# Patient Record
Sex: Female | Born: 1955
Health system: Southern US, Community
[De-identification: ages and names within clinical notes are randomized; demographics above are authoritative.]

## PROBLEM LIST (undated history)

## (undated) DIAGNOSIS — F32A Depression, unspecified: Secondary | ICD-10-CM

## (undated) DIAGNOSIS — C73 Malignant neoplasm of thyroid gland: Secondary | ICD-10-CM

## (undated) DIAGNOSIS — F419 Anxiety disorder, unspecified: Secondary | ICD-10-CM

## (undated) HISTORY — DX: Malignant neoplasm of thyroid gland: C73

---

## 1998-01-19 ENCOUNTER — Other Ambulatory Visit: Admission: RE | Admit: 1998-01-19 | Discharge: 1998-01-19 | Payer: Self-pay | Admitting: Endocrinology

## 1998-07-16 ENCOUNTER — Other Ambulatory Visit: Admission: RE | Admit: 1998-07-16 | Discharge: 1998-07-16 | Payer: Self-pay | Admitting: *Deleted

## 1999-01-10 HISTORY — PX: THYROIDECTOMY, PARTIAL: SHX18

## 2003-06-23 ENCOUNTER — Other Ambulatory Visit: Admission: RE | Admit: 2003-06-23 | Discharge: 2003-06-23 | Payer: Self-pay | Admitting: *Deleted

## 2004-09-01 ENCOUNTER — Other Ambulatory Visit: Admission: RE | Admit: 2004-09-01 | Discharge: 2004-09-01 | Payer: Self-pay | Admitting: *Deleted

## 2004-11-01 ENCOUNTER — Other Ambulatory Visit: Admission: RE | Admit: 2004-11-01 | Discharge: 2004-11-01 | Payer: Self-pay | Admitting: *Deleted

## 2005-05-30 ENCOUNTER — Other Ambulatory Visit: Admission: RE | Admit: 2005-05-30 | Discharge: 2005-05-30 | Payer: Self-pay | Admitting: *Deleted

## 2015-12-28 DIAGNOSIS — Z6820 Body mass index (BMI) 20.0-20.9, adult: Secondary | ICD-10-CM | POA: Diagnosis not present

## 2015-12-28 DIAGNOSIS — C73 Malignant neoplasm of thyroid gland: Secondary | ICD-10-CM | POA: Diagnosis not present

## 2015-12-28 DIAGNOSIS — E038 Other specified hypothyroidism: Secondary | ICD-10-CM | POA: Diagnosis not present

## 2016-05-12 DIAGNOSIS — F411 Generalized anxiety disorder: Secondary | ICD-10-CM | POA: Diagnosis not present

## 2016-05-24 DIAGNOSIS — F411 Generalized anxiety disorder: Secondary | ICD-10-CM | POA: Diagnosis not present

## 2017-04-06 DIAGNOSIS — E038 Other specified hypothyroidism: Secondary | ICD-10-CM | POA: Diagnosis not present

## 2017-04-06 DIAGNOSIS — Z0000 Encounter for general adult medical examination without abnormal findings: Secondary | ICD-10-CM | POA: Diagnosis not present

## 2017-04-11 DIAGNOSIS — F331 Major depressive disorder, recurrent, moderate: Secondary | ICD-10-CM | POA: Diagnosis not present

## 2017-04-11 DIAGNOSIS — E7800 Pure hypercholesterolemia, unspecified: Secondary | ICD-10-CM | POA: Diagnosis not present

## 2017-04-11 DIAGNOSIS — J302 Other seasonal allergic rhinitis: Secondary | ICD-10-CM | POA: Diagnosis not present

## 2017-04-11 DIAGNOSIS — Z0000 Encounter for general adult medical examination without abnormal findings: Secondary | ICD-10-CM | POA: Diagnosis not present

## 2017-04-11 DIAGNOSIS — F430 Acute stress reaction: Secondary | ICD-10-CM | POA: Diagnosis not present

## 2017-04-24 ENCOUNTER — Encounter: Payer: Self-pay | Admitting: Internal Medicine

## 2017-08-22 ENCOUNTER — Other Ambulatory Visit: Payer: Self-pay | Admitting: Internal Medicine

## 2017-08-22 DIAGNOSIS — Z1231 Encounter for screening mammogram for malignant neoplasm of breast: Secondary | ICD-10-CM

## 2017-09-25 DIAGNOSIS — E038 Other specified hypothyroidism: Secondary | ICD-10-CM | POA: Diagnosis not present

## 2017-09-25 DIAGNOSIS — F430 Acute stress reaction: Secondary | ICD-10-CM | POA: Diagnosis not present

## 2017-09-25 DIAGNOSIS — Z6821 Body mass index (BMI) 21.0-21.9, adult: Secondary | ICD-10-CM | POA: Diagnosis not present

## 2017-09-25 DIAGNOSIS — R209 Unspecified disturbances of skin sensation: Secondary | ICD-10-CM | POA: Diagnosis not present

## 2017-10-03 ENCOUNTER — Ambulatory Visit: Payer: BLUE CROSS/BLUE SHIELD | Admitting: Neurology

## 2017-10-03 ENCOUNTER — Encounter: Payer: Self-pay | Admitting: Neurology

## 2017-10-03 VITALS — BP 125/73 | HR 74 | Ht 66.0 in | Wt 132.0 lb

## 2017-10-03 DIAGNOSIS — R339 Retention of urine, unspecified: Secondary | ICD-10-CM

## 2017-10-03 DIAGNOSIS — R202 Paresthesia of skin: Secondary | ICD-10-CM | POA: Diagnosis not present

## 2017-10-03 DIAGNOSIS — R29898 Other symptoms and signs involving the musculoskeletal system: Secondary | ICD-10-CM | POA: Diagnosis not present

## 2017-10-03 DIAGNOSIS — G834 Cauda equina syndrome: Secondary | ICD-10-CM

## 2017-10-03 DIAGNOSIS — R200 Anesthesia of skin: Secondary | ICD-10-CM | POA: Diagnosis not present

## 2017-10-03 DIAGNOSIS — F419 Anxiety disorder, unspecified: Secondary | ICD-10-CM

## 2017-10-03 MED ORDER — PREDNISONE 20 MG PO TABS: 60.0000 mg | ORAL_TABLET | Freq: Every day | ORAL | 0 refills | Status: DC

## 2017-10-03 NOTE — Progress Notes (Addendum)
[facility]    Provider:  Dr Jaynee Eagles Referring Provider: Osborne Casco Fransico Him, MD Primary Care Physician:  Haywood Pao, MD  CC:  paresthesias  HPI:  Christine Collier is a 62 y.o. female here as requested by Dr. Osborne Casco for paresthesias. PMHx . She was at [facility] when she came home and slept on the couch she woke up on Monday, no inciting events, no illnesses, she drank several beers over the weekend but otherwise no changes. She noticed numbness in the toes and feet. She went to work and it felt weird didn;t really bother her but never went away. She tried to hydrate. Numbness has increased, spread up the legs even to the thighs and in the pelvic area. No back pain or radicular symptoms. No falls but she [place] the [place].More in the inner than outer thighs. Her calfs feel numb and her toes are tingly. The left is a little worse definitely above the knee now in the last 4-5 days. No weakness. Started 2 weeks ago. There is a lot of anxiety.   Reviewed notes, labs and imaging from outside physicians, which showed:  Labs 09/26/2017: B12 685, BUN 11, creat 0.7 , ferritin 172, cmp unremarkable, tsh nml, sed 2,   Reviewed Dr. Loren Racer notes. Numbness in the leg started after drinking too much at a beach wedding, drove back the next day felt like her feet were numb, she was worried it was due to the amount of liquor she drank, wearing flip flops had no issues walking  Review of Systems: Patient complains of symptoms per HPI as well as the following symptoms: acute leg numbness. Pertinent negatives and positives per HPI. All others negative.   Social History   Socioeconomic History  . Marital status: Married    Spouse name: Not on file  . Number of children: 2  . Years of education: Not on file  . Highest education level: Associate degree: academic program  Occupational History  . Not on file  Social Needs  . Financial resource strain: Not on file  . Food  insecurity:    Worry: Not on file    Inability: Not on file  . Transportation needs:    Medical: Not on file    Non-medical: Not on file  Tobacco Use  . Smoking status: Never Smoker  . Smokeless tobacco: Never Used  Substance and Sexual Activity  . Alcohol use: Yes    Comment: social  . Drug use: Not Currently    Comment: smoked pot in past but not now  . Sexual activity: Not on file  Lifestyle  . Physical activity:    Days per week: Not on file    Minutes per session: Not on file  . Stress: Not on file  Relationships  . Social connections:    Talks on phone: Not on file    Gets together: Not on file    Attends religious service: Not on file    Active member of club or organization: Not on file    Attends meetings of clubs or organizations: Not on file    Relationship status: Not on file  . Intimate partner violence:    Fear of current or ex partner: Not on file    Emotionally abused: Not on file    Physically abused: Not on file    Forced sexual activity: Not on file  Other Topics Concern  . Not on file  Social History Narrative   Lives at  home with her husband and daughter   Right handed    Caffeine: 2 cups of coffee daily    Family History  Problem Relation Age of Onset  . Colon cancer Mother   . Autoimmune disease Neg Hx     Past Medical History:  Diagnosis Date  . Thyroid cancer [facility])     Past Surgical History:  Procedure Laterality Date  . THYROIDECTOMY, PARTIAL  2001    Current Outpatient Medications  Medication Sig Dispense Refill  . levothyroxine (SYNTHROID, LEVOTHROID) 75 MCG tablet Take 75 mcg by mouth daily before breakfast.    . predniSONE (DELTASONE) 20 MG tablet Take 3 tablets (60 mg total) by mouth daily. 15 tablet 0   No current facility-administered medications for this visit.     Allergies as of 10/03/2017 - Review Complete 10/03/2017  Allergen Reaction Noted  . Polysporin [bacitracin-polymyxin b]  10/03/2017    Vitals: BP  125/73 (BP Location: Right Arm, Patient Position: Sitting)   Pulse 74   Ht 5' 6" (1.676 m)   Wt 132 lb (59.9 kg)   BMI 21.31 kg/m  Last Weight:  Wt Readings from Last 1 Encounters:  10/03/17 132 lb (59.9 kg)   Last Height:   Ht Readings from Last 1 Encounters:  10/03/17 5' 6" (1.676 m)    Physical exam: Exam: Gen: NAD, conversant, well nourised, obese, well groomed                     CV: RRR, no MRG. No Carotid Bruits. No peripheral edema, warm, nontender Eyes: Conjunctivae clear without exudates or hemorrhage  Neuro: Detailed Neurologic Exam  Speech:    Speech is normal; fluent and spontaneous with normal comprehension.  Cognition:    The patient is oriented to person, place, and time;     recent and remote memory intact;     language fluent;     normal attention, concentration,     fund of knowledge Cranial Nerves:    The pupils are equal, round, and reactive to light. The fundi are normal and spontaneous venous pulsations are present. Visual fields are full to finger confrontation. Extraocular movements are intact. Trigeminal sensation is intact and the muscles of mastication are normal. The face is symmetric. The palate elevates in the midline. Hearing intact. Voice is normal. Shoulder shrug is normal. The tongue has normal motion without fasciculations.   Coordination:    Normal finger to nose and heel to shin. Normal rapid alternating movements.   Gait:    Heel-toe and tandem gait are normal.   Motor Observation:    No asymmetry, no atrophy, and no involuntary movements noted. Tone:    Normal muscle tone.    Posture:    Posture is normal. normal erect    Strength: 4/5 prox hip flexion weakness.     Strength is V/V in the upper and lower limbs.      Sensation: intact to LT, pin prick, intact vibration distally on left but absent on the right great toe,  decreased to temp in feet bilat     Reflex Exam:  DTR's:    Deep tendon reflexes in the upper and  lower extremities are normal bilaterally.   Toes:    The toes are downgoing bilaterally.   Clonus:    Clonus is absent.      Assessment/Plan:  62 year old female with 2 weeks of acute onset, ascending numbness in the legs and now possibly in the  fingers. She denies any previous illnesses or medication changes but was drinking alcohol and staying up late the prior weekend at the beach. Denied drug use. Ate shell fish but would be unlikely for neurotoxin but can't be sure. She is extremely anxious which I suspect is the cause however may less likely be a mild form of sensory GBS. Exam was mostly unremarkable. Cannot rule out lumbar spine etiology, if no improvement or worsening need MRi lumbar spine.  - Addendum 11/05/2017: History: This is an extremely anxious 62 year old patient who was seen for acute onset ascending paresthesias in the lower extremities.  She is here with her husband today.  Extensive work-up reviewed today with both patient and husband.  This includes extensive lab testing including Lyme, hemoglobin A1c ANA with comprehensive rheumatologic panel, Sjogren's, pan ANCA, RPR, hep C, rheumatoid factor, heavy metals, multiple myeloma, vitamin B1, CBC, CMP, HIV, sed rate.  Reviewed MRI of the lumbar spine as well which showed multilevel degenerative changes with possible L3 nerve root compression, scoliosis however nothing to explain her presenting symptoms.  Husband discussed the patient has been extremely anxious and in the setting of stopping some medications.  I do think that this may be anxiety and we will watch it clinically.   - Discussed possibly getting an LP and an emg/ncs. Unfortunately it takes several weeks to get these completed outpatient. She declines an LP and we will try to move up her emg/ncs  - will follow patient carefully. Discussed risks of steroids stop for anything concerning. Tried a short course of steroids that did not help  Orders Placed This Encounter    Procedures  . MR LUMBAR SPINE WO CONTRAST  . NCV with EMG(electromyography)   Meds ordered this encounter  Medications  . predniSONE (DELTASONE) 20 MG tablet    Sig: Take 3 tablets (60 mg total) by mouth daily.    Dispense:  15 tablet    Refill:  0       [name], MD  [facility] [address] [place], [place] [address]  Phone [phone] Fax [phone]

## 2017-10-03 NOTE — Patient Instructions (Addendum)
Guillain-Barre Syndrome Guillain-Barre syndrome (GBS) is a rare disorder in which your body's defense (immune) system attacks your nervous system. GBS is a kind of autoimmune disorder. GBS is not contagious and most people with GBS recover within a few months. However, some people may still have some weakness after a few years. What are the causes? The exact cause of GBS syndrome is not known. The disorder usually develops a few days or weeks after a viral infection, such as a stomach (gastrointestinal) or breathing (respiratory) infection. Sometimes surgery or vaccinations will trigger the syndrome. What are the signs or symptoms? The most common signs and symptoms of GBS are tingling, numbness, and weakness in your lower legs. You may have more symptoms in other areas of your body after a few weeks. Signs and symptoms may eventually include:  Muscle weakness that spreads from your legs to your arms and trunk.  Difficulty breathing.  Total loss of muscle use.  Facial weakness.  Double vision.  Trouble swallowing.  Slurred speech.  Aching or burning pain.  Rapid breathing and heart rate.  Flushing or cold and clammy skin.  Dizziness when standing.  Trouble passing urine or having bowel movements.  How is this diagnosed? Your health care provider will do a physical exam to diagnose GBS. You may also have tests to confirm GBS. These may include:  A nerve conduction velocity (NCV) test to check for slowing of nerve signals.  A spinal tap to check for protein in the fluid around the spinal cord.  How is this treated? Treatment focuses on relieving symptoms and speeding up recovery. The most important part of treatment is to keep your body functioning while your nervous system recovers. Severe cases of GBS may require hospitalization. Possible treatments include:  Plasmapheresis.This is a type of blood transfusion. It removes immune system cells that are attacking your nervous  system.  Intravenous injections of special proteins (immunoglobulins or antibodies). These proteins may slow down the immune system's attack on peripheral nerves.  Medicines to control the symptoms of GBS.  Follow these instructions at home:  Physical therapy may be recommended by your health care provider. Follow your exercises as directed by your physical therapist.  Make sure you have the support you need.  Keep all follow-up visits as directed by your health care provider. This is important.  Take medicines only as directed by your health care provider. Contact a health care provider if:  You have new symptoms or your symptoms get worse.  You do not feel safe or supported at home. Get help right away if:  You have trouble breathing.  You have trouble swallowing.  You choke after eating or drinking.  You cannot move.  You pass out. This information is not intended to replace advice given to you by your health care provider. Make sure you discuss any questions you have with your health care provider. Document Released: 12/16/2001 Document Revised: 06/03/2015 Document Reviewed: 02/26/2013 Elsevier Interactive Patient Education  2018 [employer].  Prednisone tablets What is this medicine? PREDNISONE (PRED ni sone) is a corticosteroid. It is commonly used to treat inflammation of the skin, joints, lungs, and other organs. Common conditions treated include asthma, allergies, and arthritis. It is also used for other conditions, such as blood disorders and diseases of the adrenal glands. This medicine may be used for other purposes; ask your health care provider or pharmacist if you have questions. COMMON BRAND NAME(S): Deltasone, Predone, Sterapred, Sterapred DS What should I tell my  health care provider before I take this medicine? They need to know if you have any of these conditions: -Cushing's syndrome -diabetes -glaucoma -heart disease -high blood  pressure -infection (especially a virus infection such as chickenpox, cold sores, or herpes) -kidney disease -liver disease -mental illness -myasthenia gravis -osteoporosis -seizures -stomach or intestine problems -thyroid disease -an unusual or allergic reaction to lactose, prednisone, other medicines, foods, dyes, or preservatives -pregnant or trying to get pregnant -breast-feeding How should I use this medicine? Take this medicine by mouth with a glass of water. Follow the directions on the prescription label. Take this medicine with food. If you are taking this medicine once a day, take it in the morning. Do not take more medicine than you are told to take. Do not suddenly stop taking your medicine because you may develop a severe reaction. Your doctor will tell you how much medicine to take. If your doctor wants you to stop the medicine, the dose may be slowly lowered over time to avoid any side effects. Talk to your pediatrician regarding the use of this medicine in children. Special care may be needed. Overdosage: If you think you have taken too much of this medicine contact a poison control center or emergency room at once. NOTE: This medicine is only for you. Do not share this medicine with others. What if I miss a dose? If you miss a dose, take it as soon as you can. If it is almost time for your next dose, talk to your doctor or health care professional. You may need to miss a dose or take an extra dose. Do not take double or extra doses without advice. What may interact with this medicine? Do not take this medicine with any of the following medications: -metyrapone -mifepristone This medicine may also interact with the following medications: -aminoglutethimide -amphotericin B -aspirin and aspirin-like medicines -barbiturates -certain medicines for diabetes, like glipizide or glyburide -cholestyramine -cholinesterase  inhibitors -cyclosporine -digoxin -diuretics -ephedrine -female hormones, like estrogens and birth control pills -isoniazid -ketoconazole -NSAIDS, medicines for pain and inflammation, like ibuprofen or naproxen -phenytoin -rifampin -toxoids -vaccines -warfarin This list may not describe all possible interactions. Give your health care provider a list of all the medicines, herbs, non-prescription drugs, or dietary supplements you use. Also tell them if you smoke, drink alcohol, or use illegal drugs. Some items may interact with your medicine. What should I watch for while using this medicine? Visit your doctor or health care professional for regular checks on your progress. If you are taking this medicine over a prolonged period, carry an identification card with your name and address, the type and dose of your medicine, and your doctor's name and address. This medicine may increase your risk of getting an infection. Tell your doctor or health care professional if you are around anyone with measles or chickenpox, or if you develop sores or blisters that do not heal properly. If you are going to have surgery, tell your doctor or health care professional that you have taken this medicine within the last twelve months. Ask your doctor or health care professional about your diet. You may need to lower the amount of salt you eat. This medicine may affect blood sugar levels. If you have diabetes, check with your doctor or health care professional before you change your diet or the dose of your diabetic medicine. What side effects may I notice from receiving this medicine? Side effects that you should report to your doctor or health care  professional as soon as possible: -allergic reactions like skin rash, itching or hives, swelling of the face, lips, or tongue -changes in emotions or moods -changes in vision -depressed mood -eye pain -fever or chills, cough, sore throat, pain or difficulty  passing urine -increased thirst -swelling of ankles, feet Side effects that usually do not require medical attention (report to your doctor or health care professional if they continue or are bothersome): -confusion, excitement, restlessness -headache -nausea, vomiting -skin problems, acne, thin and shiny skin -trouble sleeping -weight gain This list may not describe all possible side effects. Call your doctor for medical advice about side effects. You may report side effects to FDA at 1-800-FDA-1088. Where should I keep my medicine? Keep out of the reach of children. Store at room temperature between 15 and 30 degrees C (59 and 86 degrees F). Protect from light. Keep container tightly closed. Throw away any unused medicine after the expiration date. NOTE: This sheet is a summary. It may not cover all possible information. If you have questions about this medicine, talk to your doctor, pharmacist, or health care provider.  2018 Elsevier/Gold Standard (2010-08-11 10:57:14)

## 2017-10-08 ENCOUNTER — Telehealth: Payer: Self-pay | Admitting: Neurology

## 2017-10-08 NOTE — Telephone Encounter (Signed)
Pt has been taking prednisone for 5 days. She is wanting to discuss how she is doing. Please call to advise

## 2017-10-08 NOTE — Addendum Note (Signed)
Addended by: [name] B on: 10/08/2017 06:04 PM   Modules accepted: Orders

## 2017-10-08 NOTE — Telephone Encounter (Signed)
She isn't any better. I ordered an MRI lumbar spine. Can we try to find a spot this week for an emg/ncs? Lets discuss with Beau thanks

## 2017-10-08 NOTE — Telephone Encounter (Signed)
Called pt back and her vm answered immediately however it was full. Will attempt again later.

## 2017-10-09 ENCOUNTER — Telehealth: Payer: Self-pay | Admitting: Neurology

## 2017-10-09 NOTE — Telephone Encounter (Signed)
I sent patient an email about this, no need to call thanks

## 2017-10-09 NOTE — Telephone Encounter (Signed)
Spoke with patient. Before patient schedules EMG and MRI pt wanted to know if Dr. Jaynee Eagles thinks it's a possibility that she was bitten by a tick. She wants to find out first before she schedules the EMG/NCS and MRI. She will keep her phone nearby for the call back.

## 2017-10-09 NOTE — Telephone Encounter (Signed)
Called patient to schedule sooner NCV / EMG study and VM answered immediately.

## 2017-10-09 NOTE — Telephone Encounter (Signed)
tried to leave a vmail but the vmail box was not set up & i tried calling her husband # & that number was not in serves  [place]: [phone] (exp. 10/09/17 to 11/07/17)

## 2017-10-09 NOTE — Telephone Encounter (Signed)
Spoke with Dr. Jaynee Eagles. She can check labs including Lyme before we schedule the EMG/NCS. She still advises the MRI of the lumbar spine which needs to be scheduled with Raquel Sarna.

## 2017-10-09 NOTE — Telephone Encounter (Signed)
Pt. Has nvm to leave message to schedule sooner appt.

## 2017-10-09 NOTE — Telephone Encounter (Signed)
Attempted to call patient. Her vm box answered immediately but has not been setup yet. Will try patient again later. I have an EMG/NCS to offer at 3:30 Thursday, arrival 3:00 pm.

## 2017-10-09 NOTE — Telephone Encounter (Signed)
Attempted to call pt again. Her vm box has not been setup. Phone only rang once.

## 2017-10-10 ENCOUNTER — Other Ambulatory Visit: Payer: Self-pay | Admitting: Neurology

## 2017-10-10 ENCOUNTER — Other Ambulatory Visit (INDEPENDENT_AMBULATORY_CARE_PROVIDER_SITE_OTHER): Payer: Self-pay

## 2017-10-10 DIAGNOSIS — G610 Guillain-Barre syndrome: Secondary | ICD-10-CM

## 2017-10-10 DIAGNOSIS — G608 Other hereditary and idiopathic neuropathies: Secondary | ICD-10-CM | POA: Diagnosis not present

## 2017-10-10 DIAGNOSIS — Z0289 Encounter for other administrative examinations: Secondary | ICD-10-CM

## 2017-10-10 NOTE — Telephone Encounter (Signed)
Patient came by and schedule her MRI for 10/16/17 at [facility].

## 2017-10-11 DIAGNOSIS — F430 Acute stress reaction: Secondary | ICD-10-CM | POA: Diagnosis not present

## 2017-10-11 DIAGNOSIS — F331 Major depressive disorder, recurrent, moderate: Secondary | ICD-10-CM | POA: Diagnosis not present

## 2017-10-13 LAB — PAN-ANCA
ANCA Proteinase 3: <3.5 U/mL (ref 0.0–3.5)
C-ANCA: <1:20 {titer}

## 2017-10-13 LAB — HEAVY METALS, BLOOD
ARSENIC: 6 ug/L (ref 2–23)
LEAD, BLOOD: 2 ug/dL (ref 0–4)
MERCURY: 1.7 ug/L (ref 0.0–14.9)

## 2017-10-13 LAB — MULTIPLE MYELOMA PANEL, SERUM
ALBUMIN/GLOB SERPL: 1.7 (ref 0.7–1.7)
Albumin SerPl Elph-Mcnc: 3.7 g/dL (ref 2.9–4.4)
Alpha 1: 0.2 g/dL (ref 0.0–0.4)
Alpha2 Glob SerPl Elph-Mcnc: 0.7 g/dL (ref 0.4–1.0)
B-Globulin SerPl Elph-Mcnc: 0.9 g/dL (ref 0.7–1.3)
GAMMA GLOB SERPL ELPH-MCNC: 0.6 g/dL (ref 0.4–1.8)
GLOBULIN, TOTAL: 2.3 g/dL (ref 2.2–3.9)
IGA/IMMUNOGLOBULIN A, SERUM: 106 mg/dL (ref 87–352)
IgG (Immunoglobin G), Serum: 657 mg/dL — ABNORMAL LOW (ref 700–1600)
IgM (Immunoglobulin M), Srm: 46 mg/dL (ref 26–217)

## 2017-10-13 LAB — RHEUMATOID FACTOR: RHEUMATOID FACTOR: 11.9 [IU]/mL (ref 0.0–13.9)

## 2017-10-13 LAB — COMPREHENSIVE METABOLIC PANEL
ALBUMIN: 4 g/dL (ref 3.6–4.8)
ALT: 14 IU/L (ref 0–32)
AST: 12 IU/L (ref 0–40)
Albumin/Globulin Ratio: 2 (ref 1.2–2.2)
Alkaline Phosphatase: 58 IU/L (ref 39–117)
BUN/Creatinine Ratio: 19 (ref 12–28)
BUN: 15 mg/dL (ref 8–27)
Bilirubin Total: 0.5 mg/dL (ref 0.0–1.2)
CALCIUM: 9.4 mg/dL (ref 8.7–10.3)
CO2: 25 mmol/L (ref 20–29)
CREATININE: 0.77 mg/dL (ref 0.57–1.00)
Chloride: 103 mmol/L (ref 96–106)
GFR calc Af Amer: 96 mL/min/{1.73_m2} (ref >59)
GFR calc non Af Amer: 84 mL/min/{1.73_m2} (ref >59)
GLOBULIN, TOTAL: 2 g/dL (ref 1.5–4.5)
Glucose: 89 mg/dL (ref 65–99)
Potassium: 4.4 mmol/L (ref 3.5–5.2)
SODIUM: 143 mmol/L (ref 134–144)
Total Protein: 6 g/dL (ref 6.0–8.5)

## 2017-10-13 LAB — ANA W/REFLEX: ANA: POSITIVE — AB

## 2017-10-13 LAB — CBC
HEMATOCRIT: 43.7 % (ref 34.0–46.6)
Hemoglobin: 15.1 g/dL (ref 11.1–15.9)
MCH: 30.9 pg (ref 26.6–33.0)
MCHC: 34.6 g/dL (ref 31.5–35.7)
MCV: 89 fL (ref 79–97)
PLATELETS: 360 10*3/uL (ref 150–450)
RBC: 4.89 x10E6/uL (ref 3.77–5.28)
RDW: 12.4 % (ref 12.3–15.4)
WBC: 7.8 10*3/uL (ref 3.4–10.8)

## 2017-10-13 LAB — HIV ANTIBODY (ROUTINE TESTING W REFLEX): HIV Screen 4th Generation wRfx: NONREACTIVE

## 2017-10-13 LAB — HEMOGLOBIN A1C
Est. average glucose Bld gHb Est-mCnc: 105 mg/dL
HEMOGLOBIN A1C: 5.3 % (ref 4.8–5.6)

## 2017-10-13 LAB — SJOGREN'S SYNDROME ANTIBODS(SSA + SSB): ENA SSB (LA) Ab: 2.7 AI — ABNORMAL HIGH (ref 0.0–0.9)

## 2017-10-13 LAB — ENA+DNA/DS+SJORGEN'S
DSDNA AB: 1 [IU]/mL (ref 0–9)
ENA RNP Ab: <0.2 AI (ref 0.0–0.9)
ENA SM Ab Ser-aCnc: <0.2 AI (ref 0.0–0.9)

## 2017-10-13 LAB — SEDIMENTATION RATE: SED RATE: 2 mm/h (ref 0–40)

## 2017-10-13 LAB — VITAMIN B1: Thiamine: 112.5 nmol/L (ref 66.5–200.0)

## 2017-10-13 LAB — HEPATITIS C ANTIBODY: Hep C Virus Ab: <0.1 s/co ratio (ref 0.0–0.9)

## 2017-10-13 LAB — RPR: RPR Ser Ql: NONREACTIVE

## 2017-10-13 LAB — B. BURGDORFI ANTIBODIES: Lyme IgG/IgM Ab: <0.91 {ISR} (ref 0.00–0.90)

## 2017-10-16 ENCOUNTER — Encounter: Payer: Self-pay | Admitting: *Deleted

## 2017-10-16 ENCOUNTER — Ambulatory Visit (INDEPENDENT_AMBULATORY_CARE_PROVIDER_SITE_OTHER): Payer: BLUE CROSS/BLUE SHIELD

## 2017-10-16 DIAGNOSIS — R202 Paresthesia of skin: Secondary | ICD-10-CM

## 2017-10-16 DIAGNOSIS — G834 Cauda equina syndrome: Secondary | ICD-10-CM

## 2017-10-16 DIAGNOSIS — R29898 Other symptoms and signs involving the musculoskeletal system: Secondary | ICD-10-CM

## 2017-10-16 DIAGNOSIS — R200 Anesthesia of skin: Secondary | ICD-10-CM

## 2017-10-16 DIAGNOSIS — R339 Retention of urine, unspecified: Secondary | ICD-10-CM | POA: Diagnosis not present

## 2017-11-01 ENCOUNTER — Ambulatory Visit: Payer: BLUE CROSS/BLUE SHIELD | Admitting: Neurology

## 2017-11-01 ENCOUNTER — Ambulatory Visit (INDEPENDENT_AMBULATORY_CARE_PROVIDER_SITE_OTHER): Payer: BLUE CROSS/BLUE SHIELD | Admitting: Neurology

## 2017-11-01 DIAGNOSIS — R200 Anesthesia of skin: Secondary | ICD-10-CM | POA: Diagnosis not present

## 2017-11-01 DIAGNOSIS — R202 Paresthesia of skin: Secondary | ICD-10-CM | POA: Diagnosis not present

## 2017-11-01 DIAGNOSIS — Z0289 Encounter for other administrative examinations: Secondary | ICD-10-CM

## 2017-11-01 NOTE — Patient Instructions (Addendum)
[facility]   Living With Anxiety After being diagnosed with an anxiety disorder, you may be relieved to know why you have felt or behaved a certain way. It is natural to also feel overwhelmed about the treatment ahead and what it will mean for your life. With care and support, you can manage this condition and recover from it. How to cope with anxiety Dealing with stress Stress is your body's reaction to life changes and events, both good and bad. Stress can last just a few hours or it can be ongoing. Stress can play a major role in anxiety, so it is important to learn both how to cope with stress and how to think about it differently. Talk with your health care provider or a counselor to learn more about stress reduction. He or she may suggest some stress reduction techniques, such as:  Music therapy. This can include creating or listening to music that you enjoy and that inspires you.  Mindfulness-based meditation. This involves being aware of your normal breaths, rather than trying to control your breathing. It can be done while sitting or walking.  Centering prayer. This is a kind of meditation that involves focusing on a word, phrase, or sacred image that is meaningful to you and that brings you peace.  Deep breathing. To do this, expand your stomach and inhale slowly through your nose. Hold your breath for 3-5 seconds. Then exhale slowly, allowing your stomach muscles to relax.  Self-talk. This is a skill where you identify thought patterns that lead to anxiety reactions and correct those thoughts.  Muscle relaxation. This involves tensing muscles then relaxing them.  Choose a stress reduction technique that fits your lifestyle and personality. Stress reduction techniques take time and practice. Set aside 5-15 minutes a day to do them. Therapists can offer training in these techniques. The training may be covered by some insurance plans. Other things you  can do to manage stress include:  Keeping a stress diary. This can help you learn what triggers your stress and ways to control your response.  Thinking about how you respond to certain situations. You may not be able to control everything, but you can control your reaction.  Making time for activities that help you relax, and not feeling guilty about spending your time in this way.  Therapy combined with coping and stress-reduction skills provides the best chance for successful treatment. Medicines Medicines can help ease symptoms. Medicines for anxiety include:  Anti-anxiety drugs.  Antidepressants.  Beta-blockers.  Medicines may be used as the main treatment for anxiety disorder, along with therapy, or if other treatments are not working. Medicines should be prescribed by a health care provider. Relationships Relationships can play a big part in helping you recover. Try to spend more time connecting with trusted friends and family members. Consider going to couples counseling, taking family education classes, or going to family therapy. Therapy can help you and others better understand the condition. How to recognize changes in your condition Everyone has a different response to treatment for anxiety. Recovery from anxiety happens when symptoms decrease and stop interfering with your daily activities at home or work. This may mean that you will start to:  Have better concentration and focus.  Sleep better.  Be less irritable.  Have more energy.  Have improved memory.  It is important to recognize when your condition is getting worse. Contact your health care provider if your symptoms interfere with home or work  and you do not feel like your condition is improving. Where to find help and support: You can get help and support from these sources:  Self-help groups.  Online and [employer].  A trusted spiritual leader.  Couples counseling.  Family education  classes.  Family therapy.  Follow these instructions at home:  Eat a healthy diet that includes plenty of vegetables, fruits, whole grains, low-fat dairy products, and lean protein. Do not eat a lot of foods that are high in solid fats, added sugars, or salt.  Exercise. Most adults should do the following: ? Exercise for at least 150 minutes each week. The exercise should increase your heart rate and make you sweat (moderate-intensity exercise). ? Strengthening exercises at least twice a week.  Cut down on caffeine, tobacco, alcohol, and other potentially harmful substances.  Get the right amount and quality of sleep. Most adults need 7-9 hours of sleep each night.  Make choices that simplify your life.  Take over-the-counter and prescription medicines only as told by your health care provider.  Avoid caffeine, alcohol, and certain over-the-counter cold medicines. These may make you feel worse. Ask your pharmacist which medicines to avoid.  Keep all follow-up visits as told by your health care provider. This is important. Questions to ask your health care provider  Would I benefit from therapy?  How often should I follow up with a health care provider?  How long do I need to take medicine?  Are there any long-term side effects of my medicine?  Are there any alternatives to taking medicine? Contact a health care provider if:  You have a hard time staying focused or finishing daily tasks.  You spend many hours a day feeling worried about everyday life.  You become exhausted by worry.  You start to have headaches, feel tense, or have nausea.  You urinate more than normal.  You have diarrhea. Get help right away if:  You have a racing heart and shortness of breath.  You have thoughts of hurting yourself or others. If you ever feel like you may hurt yourself or others, or have thoughts about taking your own life, get help right away. You can go to your nearest emergency  department or call:  Your local emergency services (911 in the [place]  A suicide crisis helpline, such as the [facility] at 585-077-3173. This is open 24-hours a day.  Summary  Taking steps to deal with stress can help calm you.  Medicines cannot cure anxiety disorders, but they can help ease symptoms.  Family, friends, and partners can play a big part in helping you recover from an anxiety disorder. This information is not intended to replace advice given to you by your health care provider. Make sure you discuss any questions you have with your health care provider. Document Released: 12/21/2015 Document Revised: 12/21/2015 Document Reviewed: 12/21/2015 Elsevier Interactive Patient Education  [employer].

## 2017-11-01 NOTE — Progress Notes (Signed)
See procedure note.

## 2017-11-05 NOTE — Progress Notes (Signed)
Full Name: Christine Collier Gender: Female MRN #: [id number] Date of Birth: Aug 24, 1955    Visit Date: 11/01/2017 09:06 Age: 62 Years 1 Months Old Examining Physician: Domenick Gong, MD  Referring Physician: Jaynee Eagles, MD  History: Patient with acute onset paresthesias in the limbs in the setting of anxiety  Summary: EMG/NCS performed on the right arm and right leg    Conclusion: This is a normal study.  [name] M.D.  [facility] [place], [place] [address] Tel: [phone] Fax: [phone]        [facility]    Nerve / Sites Muscle Latency Ref. Amplitude Ref. Rel Amp Segments Distance Velocity Ref. Area    ms ms mV mV %  cm m/s m/s mVms  R Median - APB     Wrist APB 2.6 ?4.4 6.8 ?4.0 100 Wrist - APB 7   27.1     Upper arm APB 6.3  6.6  97.9 Upper arm - Wrist 20 55 ?49 25.4  R Ulnar - ADM     Wrist ADM 2.3 ?3.3 6.9 ?6.0 100 Wrist - ADM 7   21.2     B.Elbow ADM 5.4  6.8  98.9 B.Elbow - Wrist 19 62 ?49 20.9     A.Elbow ADM 7.3  6.8  99.6 A.Elbow - B.Elbow 12 61 ?49 21.5         A.Elbow - Wrist      R Peroneal - EDB     Ankle EDB 5.3 ?6.5 5.0 ?2.0 100 Ankle - EDB 9   21.6     Fib head EDB 10.8  4.7  94.2 Fib head - Ankle 29 53 ?44 21.2     Pop fossa EDB 12.8  4.9  105 Pop fossa - Fib head 10 49 ?44 22.1         Pop fossa - Ankle      R Tibial - AH     Ankle AH 4.0 ?5.8 20.7 ?4.0 100 Ankle - AH 9   51.1     Pop fossa AH 12.4  17.0  81.9 Pop fossa - Ankle 38 45 ?41 44.0             SNC    Nerve / Sites Rec. Site Peak Lat Ref.  Amp Ref. Segments Distance Peak Diff Ref.    ms ms V V  cm ms ms  R Sural - Ankle (Calf)     Calf Ankle 3.9 ?4.4 25 ?6 Calf - Ankle 14    R Superficial peroneal - Ankle     Lat leg Ankle 4.0 ?4.4 8 ?6 Lat leg - Ankle 14    R Median, Ulnar - Transcarpal comparison     Median Palm Wrist 2.0 ?2.2 72 ?35 Median Palm - Wrist 8       Ulnar Palm Wrist 2.1 ?2.2 32 ?12 Ulnar Palm - Wrist 8          Median Palm -  Ulnar Palm  -0.1 ?0.4  R Median - Orthodromic (Dig II, Mid palm)     Dig II Wrist 3.1 ?3.4 32 ?10 Dig II - Wrist 13    R Ulnar - Orthodromic, (Dig V, Mid palm)     Dig V Wrist 2.6 ?3.1 16 ?5 Dig V - Wrist 75                 F  Wave    Nerve F Lat Ref.  ms ms  R Tibial - AH 49.4 ?56.0  R Ulnar - ADM 26.3 ?32.0         EMG full       EMG Summary Table    Spontaneous MUAP Recruitment  Muscle IA Fib PSW Fasc Other Amp Dur. Poly Pattern  R. Vastus medialis Normal None None None _______ Normal Normal Normal Normal  R. Tibialis anterior Normal None None None _______ Normal Normal Normal Normal  R. Gastrocnemius (Medial head) Normal None None None _______ Normal Normal Normal Normal  R. Extensor hallucis longus Normal None None None _______ Normal Normal Normal Normal  R. Abductor hallucis Normal None None None _______ Normal Normal Normal Normal

## 2017-11-05 NOTE — Progress Notes (Signed)
History: This is an extremely anxious 62 year old patient who was seen for acute onset ascending paresthesias in the lower extremities.  She is here with her husband today.  Extensive work-up reviewed today with both patient and husband.  This includes extensive lab testing including Lyme, hemoglobin A1c ANA with comprehensive rheumatologic panel, Sjogren's, pan ANCA, RPR, hep C, rheumatoid factor, heavy metals, multiple myeloma, vitamin B1, CBC, CMP, HIV, sed rate.  Reviewed MRI of the lumbar spine as well which showed multilevel degenerative changes with possible L3 nerve root compression, scoliosis however nothing to explain her presenting symptoms.  Husband discussed the patient has been extremely anxious and in the setting of stopping some medications.  I do think that this may be anxiety and we will watch it clinically.   A total of 15 minutes was spent face-to-face with this patient. Over half this time was spent on counseling patient on the  1. Numbness and tingling of both legs     diagnosis and different diagnostic and therapeutic options, counseling and coordination of care, risks ans benefits of management, compliance, or risk factor reduction and education.  This does not include time spent on emg/ncs

## 2017-11-05 NOTE — Procedures (Signed)
Full Name: Christine Collier Gender: Female MRN #: [id number] Date of Birth: 02-08-1955    Visit Date: 11/01/2017 09:06 Age: 62 Years 1 Months Old Examining Physician: Domenick Gong, MD  Referring Physician: Jaynee Eagles, MD  History: Patient with acute onset paresthesias in the limbs in the setting of anxiety  Summary: EMG/NCS performed on the right arm and right leg    Conclusion: This is a normal study.  [name] M.D.  [facility] [place], [place] [address] Tel: [phone] Fax: [phone]        [facility]    Nerve / Sites Muscle Latency Ref. Amplitude Ref. Rel Amp Segments Distance Velocity Ref. Area    ms ms mV mV %  cm m/s m/s mVms  R Median - APB     Wrist APB 2.6 ?4.4 6.8 ?4.0 100 Wrist - APB 7   27.1     Upper arm APB 6.3  6.6  97.9 Upper arm - Wrist 20 55 ?49 25.4  R Ulnar - ADM     Wrist ADM 2.3 ?3.3 6.9 ?6.0 100 Wrist - ADM 7   21.2     B.Elbow ADM 5.4  6.8  98.9 B.Elbow - Wrist 19 62 ?49 20.9     A.Elbow ADM 7.3  6.8  99.6 A.Elbow - B.Elbow 12 61 ?49 21.5         A.Elbow - Wrist      R Peroneal - EDB     Ankle EDB 5.3 ?6.5 5.0 ?2.0 100 Ankle - EDB 9   21.6     Fib head EDB 10.8  4.7  94.2 Fib head - Ankle 29 53 ?44 21.2     Pop fossa EDB 12.8  4.9  105 Pop fossa - Fib head 10 49 ?44 22.1         Pop fossa - Ankle      R Tibial - AH     Ankle AH 4.0 ?5.8 20.7 ?4.0 100 Ankle - AH 9   51.1     Pop fossa AH 12.4  17.0  81.9 Pop fossa - Ankle 38 45 ?41 44.0             SNC    Nerve / Sites Rec. Site Peak Lat Ref.  Amp Ref. Segments Distance Peak Diff Ref.    ms ms V V  cm ms ms  R Sural - Ankle (Calf)     Calf Ankle 3.9 ?4.4 25 ?6 Calf - Ankle 14    R Superficial peroneal - Ankle     Lat leg Ankle 4.0 ?4.4 8 ?6 Lat leg - Ankle 14    R Median, Ulnar - Transcarpal comparison     Median Palm Wrist 2.0 ?2.2 72 ?35 Median Palm - Wrist 8       Ulnar Palm Wrist 2.1 ?2.2 32 ?12 Ulnar Palm - Wrist 8          Median Palm - Ulnar  Palm  -0.1 ?0.4  R Median - Orthodromic (Dig II, Mid palm)     Dig II Wrist 3.1 ?3.4 32 ?10 Dig II - Wrist 13    R Ulnar - Orthodromic, (Dig V, Mid palm)     Dig V Wrist 2.6 ?3.1 16 ?5 Dig V - Wrist 39                 F  Wave    Nerve F Lat Ref.  ms ms  R Tibial - AH 49.4 ?56.0  R Ulnar - ADM 26.3 ?32.0         EMG full       EMG Summary Table    Spontaneous MUAP Recruitment  Muscle IA Fib PSW Fasc Other Amp Dur. Poly Pattern  R. Vastus medialis Normal None None None _______ Normal Normal Normal Normal  R. Tibialis anterior Normal None None None _______ Normal Normal Normal Normal  R. Gastrocnemius (Medial head) Normal None None None _______ Normal Normal Normal Normal  R. Extensor hallucis longus Normal None None None _______ Normal Normal Normal Normal  R. Abductor hallucis Normal None None None _______ Normal Normal Normal Normal

## 2018-02-14 DIAGNOSIS — R209 Unspecified disturbances of skin sensation: Secondary | ICD-10-CM | POA: Diagnosis not present

## 2018-02-14 DIAGNOSIS — E079 Disorder of thyroid, unspecified: Secondary | ICD-10-CM | POA: Diagnosis not present

## 2018-02-14 DIAGNOSIS — Z85850 Personal history of malignant neoplasm of thyroid: Secondary | ICD-10-CM | POA: Diagnosis not present

## 2018-02-14 DIAGNOSIS — E039 Hypothyroidism, unspecified: Secondary | ICD-10-CM | POA: Diagnosis not present

## 2018-02-14 DIAGNOSIS — E042 Nontoxic multinodular goiter: Secondary | ICD-10-CM | POA: Diagnosis not present

## 2018-02-18 ENCOUNTER — Other Ambulatory Visit: Payer: Self-pay | Admitting: Internal Medicine

## 2018-02-18 DIAGNOSIS — E042 Nontoxic multinodular goiter: Secondary | ICD-10-CM

## 2018-02-18 DIAGNOSIS — E079 Disorder of thyroid, unspecified: Secondary | ICD-10-CM

## 2018-02-20 ENCOUNTER — Ambulatory Visit
Admission: RE | Admit: 2018-02-20 | Discharge: 2018-02-20 | Disposition: A | Discharge status: Home / Self Care | Payer: Self-pay | Source: Ambulatory Visit | Attending: Internal Medicine | Admitting: Internal Medicine

## 2018-02-20 ENCOUNTER — Other Ambulatory Visit: Payer: Self-pay | Admitting: Internal Medicine

## 2018-02-20 DIAGNOSIS — E079 Disorder of thyroid, unspecified: Secondary | ICD-10-CM

## 2018-02-20 DIAGNOSIS — E042 Nontoxic multinodular goiter: Secondary | ICD-10-CM

## 2018-03-07 ENCOUNTER — Ambulatory Visit
Admission: RE | Admit: 2018-03-07 | Discharge: 2018-03-07 | Disposition: A | Discharge status: Home / Self Care | Payer: BLUE CROSS/BLUE SHIELD | Source: Ambulatory Visit | Attending: Internal Medicine | Admitting: Internal Medicine

## 2018-03-07 ENCOUNTER — Other Ambulatory Visit (HOSPITAL_COMMUNITY)
Admission: RE | Admit: 2018-03-07 | Discharge: 2018-03-07 | Disposition: A | Discharge status: Home / Self Care | Payer: BLUE CROSS/BLUE SHIELD | Source: Ambulatory Visit | Attending: Radiology | Admitting: Radiology

## 2018-03-07 DIAGNOSIS — E042 Nontoxic multinodular goiter: Secondary | ICD-10-CM | POA: Diagnosis not present

## 2018-03-07 DIAGNOSIS — E079 Disorder of thyroid, unspecified: Secondary | ICD-10-CM | POA: Insufficient documentation

## 2018-03-07 DIAGNOSIS — E041 Nontoxic single thyroid nodule: Secondary | ICD-10-CM | POA: Diagnosis not present

## 2018-03-07 DIAGNOSIS — K130 Diseases of lips: Secondary | ICD-10-CM | POA: Diagnosis not present

## 2018-03-13 DIAGNOSIS — E042 Nontoxic multinodular goiter: Secondary | ICD-10-CM | POA: Diagnosis not present

## 2018-03-20 DIAGNOSIS — F411 Generalized anxiety disorder: Secondary | ICD-10-CM | POA: Diagnosis not present

## 2018-03-27 DIAGNOSIS — F411 Generalized anxiety disorder: Secondary | ICD-10-CM | POA: Diagnosis not present

## 2018-03-28 ENCOUNTER — Encounter (HOSPITAL_COMMUNITY): Payer: Self-pay | Admitting: Internal Medicine

## 2018-04-01 DIAGNOSIS — F411 Generalized anxiety disorder: Secondary | ICD-10-CM | POA: Diagnosis not present

## 2018-04-08 DIAGNOSIS — F411 Generalized anxiety disorder: Secondary | ICD-10-CM | POA: Diagnosis not present

## 2018-04-09 DIAGNOSIS — E038 Other specified hypothyroidism: Secondary | ICD-10-CM | POA: Diagnosis not present

## 2018-04-09 DIAGNOSIS — E7800 Pure hypercholesterolemia, unspecified: Secondary | ICD-10-CM | POA: Diagnosis not present

## 2018-04-12 DIAGNOSIS — F331 Major depressive disorder, recurrent, moderate: Secondary | ICD-10-CM | POA: Diagnosis not present

## 2018-04-12 DIAGNOSIS — F411 Generalized anxiety disorder: Secondary | ICD-10-CM | POA: Diagnosis not present

## 2018-04-16 DIAGNOSIS — F331 Major depressive disorder, recurrent, moderate: Secondary | ICD-10-CM | POA: Diagnosis not present

## 2018-04-16 DIAGNOSIS — Z85850 Personal history of malignant neoplasm of thyroid: Secondary | ICD-10-CM | POA: Diagnosis not present

## 2018-04-16 DIAGNOSIS — F411 Generalized anxiety disorder: Secondary | ICD-10-CM | POA: Diagnosis not present

## 2018-04-16 DIAGNOSIS — Z1331 Encounter for screening for depression: Secondary | ICD-10-CM | POA: Diagnosis not present

## 2018-04-16 DIAGNOSIS — E042 Nontoxic multinodular goiter: Secondary | ICD-10-CM | POA: Diagnosis not present

## 2018-04-16 DIAGNOSIS — E079 Disorder of thyroid, unspecified: Secondary | ICD-10-CM | POA: Diagnosis not present

## 2018-04-16 DIAGNOSIS — Z0000 Encounter for general adult medical examination without abnormal findings: Secondary | ICD-10-CM | POA: Diagnosis not present

## 2018-04-30 DIAGNOSIS — F411 Generalized anxiety disorder: Secondary | ICD-10-CM | POA: Diagnosis not present

## 2018-05-01 DIAGNOSIS — D440 Neoplasm of uncertain behavior of thyroid gland: Secondary | ICD-10-CM | POA: Diagnosis not present

## 2018-05-01 DIAGNOSIS — E042 Nontoxic multinodular goiter: Secondary | ICD-10-CM | POA: Diagnosis not present

## 2018-05-09 DIAGNOSIS — F411 Generalized anxiety disorder: Secondary | ICD-10-CM | POA: Diagnosis not present

## 2018-05-30 DIAGNOSIS — F411 Generalized anxiety disorder: Secondary | ICD-10-CM | POA: Diagnosis not present

## 2018-06-17 DIAGNOSIS — F411 Generalized anxiety disorder: Secondary | ICD-10-CM | POA: Diagnosis not present

## 2018-06-26 DIAGNOSIS — F411 Generalized anxiety disorder: Secondary | ICD-10-CM | POA: Diagnosis not present

## 2018-07-03 DIAGNOSIS — F411 Generalized anxiety disorder: Secondary | ICD-10-CM | POA: Diagnosis not present

## 2018-07-15 DIAGNOSIS — F411 Generalized anxiety disorder: Secondary | ICD-10-CM | POA: Diagnosis not present

## 2018-07-29 DIAGNOSIS — F411 Generalized anxiety disorder: Secondary | ICD-10-CM | POA: Diagnosis not present

## 2018-08-05 DIAGNOSIS — F411 Generalized anxiety disorder: Secondary | ICD-10-CM | POA: Diagnosis not present

## 2018-08-26 DIAGNOSIS — F411 Generalized anxiety disorder: Secondary | ICD-10-CM | POA: Diagnosis not present

## 2018-08-27 ENCOUNTER — Encounter: Payer: Self-pay | Admitting: Adult Health

## 2018-08-27 ENCOUNTER — Ambulatory Visit (INDEPENDENT_AMBULATORY_CARE_PROVIDER_SITE_OTHER): Payer: BC Managed Care – PPO | Admitting: Adult Health

## 2018-08-27 ENCOUNTER — Other Ambulatory Visit: Payer: Self-pay

## 2018-08-27 DIAGNOSIS — F411 Generalized anxiety disorder: Secondary | ICD-10-CM | POA: Diagnosis not present

## 2018-08-27 DIAGNOSIS — F331 Major depressive disorder, recurrent, moderate: Secondary | ICD-10-CM | POA: Diagnosis not present

## 2018-08-27 MED ORDER — ALPRAZOLAM 0.25 MG PO TABS: 0.2500 mg | ORAL_TABLET | Freq: Three times a day (TID) | ORAL | 2 refills | Status: DC | PRN

## 2018-08-27 NOTE — Progress Notes (Signed)
Christine Collier [id number] 11/02/1955 63 y.o.  Subjective:   Patient ID:  Christine Collier is a 63 y.o. (DOB 25-Feb-1955) female.  Chief Complaint:  Chief Complaint  Patient presents with  . Anxiety  . Depression  . Insomnia    HPI  Christine Collier presents to the office today for follow-up of anxiety, depression, and insomnia.  Describes mood today as "ok". Pleasant. Mood symptoms - reports some depression, anxiety, and irritability at times. Stating "I'm doing pretty good now". Feels like Cymbalta and Xanax are working well for her. Currently staying at [facility] with sister. Stable interest and motivation Taking medications as prescribed.  Energy levels stable. Active, does not have a regular exercise routine. Is out of work currently but plans to return upcoming. Enjoys some usual interests and activities. Spending time with family. Appetite adequate. Weight stable. Sleeps well most nights. Averages 6 to 8 hours. Focus and concentration stable. Completing tasks. Managing aspects of household.  Denies SI or HI. Denies AH or VH.   Review of Systems:  Review of Systems  Musculoskeletal: Negative for gait problem.  Neurological: Negative for tremors.  Psychiatric/Behavioral:       Please refer to HPI    Medications: I have reviewed the patient's current medications.  Current Outpatient Medications  Medication Sig Dispense Refill  . ALPRAZolam (XANAX) 0.25 MG tablet Take 1 tablet (0.25 mg total) by mouth 3 (three) times daily as needed. for anxiety 90 tablet 2  . DULoxetine (CYMBALTA) 60 MG capsule Take 60 mg by mouth daily.    Marland Kitchen levothyroxine (SYNTHROID, LEVOTHROID) 75 MCG tablet Take 75 mcg by mouth daily before breakfast.    . predniSONE (DELTASONE) 20 MG tablet Take 3 tablets (60 mg total) by mouth daily. 15 tablet 0   No current facility-administered medications for this visit.     Medication Side Effects: None  Allergies:  Allergies  Allergen  Reactions  . Polysporin [Bacitracin-Polymyxin B]     Past Medical History:  Diagnosis Date  . Thyroid cancer ([place])     Family History  Problem Relation Age of Onset  . Colon cancer Mother   . Autoimmune disease Neg Hx     Social History   Socioeconomic History  . Marital status: Married    Spouse name: Not on file  . Number of children: 2  . Years of education: Not on file  . Highest education level: Associate degree: academic program  Occupational History  . Not on file  Social Needs  . Financial resource strain: Not on file  . Food insecurity    Worry: Not on file    Inability: Not on file  . Transportation needs    Medical: Not on file    Non-medical: Not on file  Tobacco Use  . Smoking status: Never Smoker  . Smokeless tobacco: Never Used  Substance and Sexual Activity  . Alcohol use: Yes    Comment: social  . Drug use: Not Currently    Comment: smoked pot in past but not now  . Sexual activity: Not on file  Lifestyle  . Physical activity    Days per week: Not on file    Minutes per session: Not on file  . Stress: Not on file  Relationships  . Social Herbalist on phone: Not on file    Gets together: Not on file    Attends religious service: Not on file    Active member of club or  organization: Not on file    Attends meetings of clubs or organizations: Not on file    Relationship status: Not on file  . Intimate partner violence    Fear of current or ex partner: Not on file    Emotionally abused: Not on file    Physically abused: Not on file    Forced sexual activity: Not on file  Other Topics Concern  . Not on file  Social History Narrative   Lives at home with her husband and daughter   Right handed    Caffeine: 2 cups of coffee daily    Past Medical History, Surgical history, Social history, and Family history were reviewed and updated as appropriate.   Please see review of systems for further details on the patient's review from  today.   Objective:   Physical Exam:  There were no vitals taken for this visit.  Physical Exam Constitutional:      General: She is not in acute distress.    Appearance: She is well-developed.  Musculoskeletal:        General: No deformity.  Neurological:     Mental Status: She is alert and oriented to person, place, and time.     Coordination: Coordination normal.  Psychiatric:        Attention and Perception: Attention and perception normal. She does not perceive auditory or visual hallucinations.        Mood and Affect: Mood normal. Mood is not anxious or depressed. Affect is not labile, blunt, angry or inappropriate.        Speech: Speech normal.        Behavior: Behavior normal.        Thought Content: Thought content normal. Thought content is not paranoid or delusional. Thought content does not include homicidal or suicidal ideation. Thought content does not include homicidal or suicidal plan.        Cognition and Memory: Cognition and memory normal.        Judgment: Judgment normal.     Comments: Insight intact     Lab Review:     Component Value Date/Time   NA 143 10/10/2017 1414   K 4.4 10/10/2017 1414   CL 103 10/10/2017 1414   CO2 25 10/10/2017 1414   GLUCOSE 89 10/10/2017 1414   BUN 15 10/10/2017 1414   CREATININE 0.77 10/10/2017 1414   CALCIUM 9.4 10/10/2017 1414   PROT 6.0 10/10/2017 1414   ALBUMIN 4.0 10/10/2017 1414   AST 12 10/10/2017 1414   ALT 14 10/10/2017 1414   ALKPHOS 58 10/10/2017 1414   BILITOT 0.5 10/10/2017 1414   GFRNONAA 84 10/10/2017 1414   GFRAA 96 10/10/2017 1414       Component Value Date/Time   WBC 7.8 10/10/2017 1414   RBC 4.89 10/10/2017 1414   HGB 15.1 10/10/2017 1414   HCT 43.7 10/10/2017 1414   PLT 360 10/10/2017 1414   MCV 89 10/10/2017 1414   MCH 30.9 10/10/2017 1414   MCHC 34.6 10/10/2017 1414   RDW 12.4 10/10/2017 1414    No results found for: POCLITH, LITHIUM   No results found for: PHENYTOIN, PHENOBARB,  VALPROATE, CBMZ   .res Assessment: Plan:    Plan:  1. Xanax 0.25mg  TID prn anxiety  RTC 4 weeks  Discussed potential benefits, risk, and side effects of benzodiazepines to include potential risk of tolerance and dependence, as well as possible drowsiness.  Advised patient not to drive if experiencing drowsiness and to take lowest  possible effective dose to minimize risk of dependence and tolerance.  Christine Collier was seen today for anxiety, depression and insomnia.  Diagnoses and all orders for this visit:  Generalized anxiety disorder -     ALPRAZolam (XANAX) 0.25 MG tablet; Take 1 tablet (0.25 mg total) by mouth 3 (three) times daily as needed. for anxiety  Major depressive disorder, recurrent episode, moderate ([place])     Please see After Visit Summary for patient specific instructions.  No future appointments.  No orders of the defined types were placed in this encounter.   -------------------------------

## 2018-09-09 DIAGNOSIS — F411 Generalized anxiety disorder: Secondary | ICD-10-CM | POA: Diagnosis not present

## 2018-09-30 DIAGNOSIS — F411 Generalized anxiety disorder: Secondary | ICD-10-CM | POA: Diagnosis not present

## 2018-10-03 ENCOUNTER — Other Ambulatory Visit: Payer: Self-pay

## 2018-10-03 ENCOUNTER — Ambulatory Visit (INDEPENDENT_AMBULATORY_CARE_PROVIDER_SITE_OTHER): Payer: BC Managed Care – PPO | Admitting: Adult Health

## 2018-10-03 ENCOUNTER — Encounter: Payer: Self-pay | Admitting: Adult Health

## 2018-10-03 DIAGNOSIS — F411 Generalized anxiety disorder: Secondary | ICD-10-CM | POA: Diagnosis not present

## 2018-10-03 DIAGNOSIS — F410 Panic disorder [episodic paroxysmal anxiety] without agoraphobia: Secondary | ICD-10-CM | POA: Diagnosis not present

## 2018-10-03 DIAGNOSIS — G4700 Insomnia, unspecified: Secondary | ICD-10-CM

## 2018-10-03 DIAGNOSIS — F331 Major depressive disorder, recurrent, moderate: Secondary | ICD-10-CM

## 2018-10-03 MED ORDER — DULOXETINE HCL 30 MG PO CPEP: ORAL_CAPSULE | ORAL | 0 refills | Status: DC

## 2018-10-03 MED ORDER — SERTRALINE HCL 50 MG PO TABS: 50.0000 mg | ORAL_TABLET | Freq: Every day | ORAL | 2 refills | Status: DC

## 2018-10-03 NOTE — Progress Notes (Signed)
Christine Collier [id number] 06/29/1955 63 y.o.  Subjective:   Patient ID:  Christine Collier is a 63 y.o. (DOB May 31, 1955) female.  Chief Complaint:  No chief complaint on file.   HPI  [name] presents to the office today for follow-up of anxiety, depression, and insomnia.  Describes mood today as "not the best". Pleasant. Mood symptoms - reports depression, anxiety, and irritability. Stating "I don't feel like the Cymbalta is helping me like it should". Asking to restart the Zoloft. Staying at home and going to sister's at the beach. Not wanting to gettout much. Has days where she "feels good, and then has days with no interest and motivation". Had been on Zoloft for years and it worked well. Was doing well and decided to stop it. Decreased interest and motivation Taking medications as prescribed.  Energy levels stable. Active, does not have a regular exercise routine. Still "furlowed" from eye clinic. Enjoys some usual interests and activities. Spending time with family - husband - daughter. Son and wife building a house in [place]. Parents in [place]. Sister at [facility]. Appetite adequate. Weight stable. Sleeps well most nights. Averages 6 to 8 hours. Focus and concentration stable. Completing tasks. Managing aspects of household.  Denies SI or HI. Denies AH or VH.  Review of Systems:  Review of Systems  Musculoskeletal: Negative for gait problem.  Neurological: Negative for tremors.  Psychiatric/Behavioral: The patient is nervous/anxious.        Please refer to HPI    Medications: I have reviewed the patient's current medications.  Current Outpatient Medications  Medication Sig Dispense Refill  . ALPRAZolam (XANAX) 0.25 MG tablet Take 1 tablet (0.25 mg total) by mouth 3 (three) times daily as needed. for anxiety 90 tablet 2  . DULoxetine (CYMBALTA) 30 MG capsule Take one capsule daily for 7 days, then discontinue. 7 capsule 0  . levothyroxine  (SYNTHROID, LEVOTHROID) 75 MCG tablet Take 75 mcg by mouth daily before breakfast.    . predniSONE (DELTASONE) 20 MG tablet Take 3 tablets (60 mg total) by mouth daily. 15 tablet 0  . sertraline (ZOLOFT) 50 MG tablet Take 1 tablet (50 mg total) by mouth daily. 30 tablet 2   No current facility-administered medications for this visit.     Medication Side Effects: None  Allergies:  Allergies  Allergen Reactions  . Polysporin [Bacitracin-Polymyxin B]     Past Medical History:  Diagnosis Date  . Thyroid cancer ([place])     Family History  Problem Relation Age of Onset  . Colon cancer Mother   . Autoimmune disease Neg Hx     Social History   Socioeconomic History  . Marital status: Married    Spouse name: Not on file  . Number of children: 2  . Years of education: Not on file  . Highest education level: Associate degree: academic program  Occupational History  . Not on file  Social Needs  . Financial resource strain: Not on file  . Food insecurity    Worry: Not on file    Inability: Not on file  . Transportation needs    Medical: Not on file    Non-medical: Not on file  Tobacco Use  . Smoking status: Never Smoker  . Smokeless tobacco: Never Used  Substance and Sexual Activity  . Alcohol use: Yes    Comment: social  . Drug use: Not Currently    Comment: smoked pot in past but not now  . Sexual activity: Not  on file  Lifestyle  . Physical activity    Days per week: Not on file    Minutes per session: Not on file  . Stress: Not on file  Relationships  . Social Herbalist on phone: Not on file    Gets together: Not on file    Attends religious service: Not on file    Active member of club or organization: Not on file    Attends meetings of clubs or organizations: Not on file    Relationship status: Not on file  . Intimate partner violence    Fear of current or ex partner: Not on file    Emotionally abused: Not on file    Physically abused: Not on  file    Forced sexual activity: Not on file  Other Topics Concern  . Not on file  Social History Narrative   Lives at home with her husband and daughter   Right handed    Caffeine: 2 cups of coffee daily    Past Medical History, Surgical history, Social history, and Family history were reviewed and updated as appropriate.   Please see review of systems for further details on the patient's review from today.   Objective:   Physical Exam:  There were no vitals taken for this visit.  Physical Exam Constitutional:      General: She is not in acute distress.    Appearance: She is well-developed.  Musculoskeletal:        General: No deformity.  Neurological:     Mental Status: She is alert and oriented to person, place, and time.     Coordination: Coordination normal.  Psychiatric:        Attention and Perception: Attention and perception normal. She does not perceive auditory or visual hallucinations.        Mood and Affect: Mood is anxious and depressed. Affect is not labile, blunt, angry or inappropriate.        Speech: Speech normal.        Behavior: Behavior normal.        Thought Content: Thought content normal. Thought content is not paranoid or delusional. Thought content does not include homicidal or suicidal ideation. Thought content does not include homicidal or suicidal plan.        Cognition and Memory: Cognition and memory normal.        Judgment: Judgment normal.     Comments: Insight intact     Lab Review:     Component Value Date/Time   NA 143 10/10/2017 1414   K 4.4 10/10/2017 1414   CL 103 10/10/2017 1414   CO2 25 10/10/2017 1414   GLUCOSE 89 10/10/2017 1414   BUN 15 10/10/2017 1414   CREATININE 0.77 10/10/2017 1414   CALCIUM 9.4 10/10/2017 1414   PROT 6.0 10/10/2017 1414   ALBUMIN 4.0 10/10/2017 1414   AST 12 10/10/2017 1414   ALT 14 10/10/2017 1414   ALKPHOS 58 10/10/2017 1414   BILITOT 0.5 10/10/2017 1414   GFRNONAA 84 10/10/2017 1414   GFRAA  96 10/10/2017 1414       Component Value Date/Time   WBC 7.8 10/10/2017 1414   RBC 4.89 10/10/2017 1414   HGB 15.1 10/10/2017 1414   HCT 43.7 10/10/2017 1414   PLT 360 10/10/2017 1414   MCV 89 10/10/2017 1414   MCH 30.9 10/10/2017 1414   MCHC 34.6 10/10/2017 1414   RDW 12.4 10/10/2017 1414    No results found  for: POCLITH, LITHIUM   No results found for: PHENYTOIN, PHENOBARB, VALPROATE, CBMZ   .res Assessment: Plan:    Plan:  1. Xanax 0.25mg  TID prn anxiety 2. Decrease Cymbalta 60mg  daily to 30mg  daily x 7 days, and discontinue 3. Add Zoloft 50mg  - 25mg  x 7 days, then 50mg  daily  RTC 4 weeks  Patient advised to contact office with any questions, adverse effects, or acute worsening in signs and symptoms.  Discussed potential benefits, risk, and side effects of benzodiazepines to include potential risk of tolerance and dependence, as well as possible drowsiness.  Advised patient not to drive if experiencing drowsiness and to take lowest possible effective dose to minimize risk of dependence and tolerance.  Diagnoses and all orders for this visit:  Major depressive disorder, recurrent episode, moderate (HCC) -     DULoxetine (CYMBALTA) 30 MG capsule; Take one capsule daily for 7 days, then discontinue. -     sertraline (ZOLOFT) 50 MG tablet; Take 1 tablet (50 mg total) by mouth daily.  Generalized anxiety disorder -     DULoxetine (CYMBALTA) 30 MG capsule; Take one capsule daily for 7 days, then discontinue. -     sertraline (ZOLOFT) 50 MG tablet; Take 1 tablet (50 mg total) by mouth daily.  Panic attacks -     DULoxetine (CYMBALTA) 30 MG capsule; Take one capsule daily for 7 days, then discontinue. -     sertraline (ZOLOFT) 50 MG tablet; Take 1 tablet (50 mg total) by mouth daily.  Insomnia, unspecified type -     DULoxetine (CYMBALTA) 30 MG capsule; Take one capsule daily for 7 days, then discontinue. -     sertraline (ZOLOFT) 50 MG tablet; Take 1 tablet (50 mg  total) by mouth daily.     Please see After Visit Summary for patient specific instructions.  No future appointments.  No orders of the defined types were placed in this encounter.   -------------------------------

## 2018-10-16 DIAGNOSIS — E042 Nontoxic multinodular goiter: Secondary | ICD-10-CM | POA: Diagnosis not present

## 2018-10-16 DIAGNOSIS — J302 Other seasonal allergic rhinitis: Secondary | ICD-10-CM | POA: Diagnosis not present

## 2018-10-16 DIAGNOSIS — Z85850 Personal history of malignant neoplasm of thyroid: Secondary | ICD-10-CM | POA: Diagnosis not present

## 2018-10-16 DIAGNOSIS — F430 Acute stress reaction: Secondary | ICD-10-CM | POA: Diagnosis not present

## 2018-10-22 ENCOUNTER — Other Ambulatory Visit: Payer: Self-pay | Admitting: Internal Medicine

## 2018-10-22 DIAGNOSIS — Z1231 Encounter for screening mammogram for malignant neoplasm of breast: Secondary | ICD-10-CM

## 2018-10-25 ENCOUNTER — Other Ambulatory Visit: Payer: Self-pay | Admitting: Surgery

## 2018-10-25 DIAGNOSIS — E042 Nontoxic multinodular goiter: Secondary | ICD-10-CM

## 2018-10-25 DIAGNOSIS — D440 Neoplasm of uncertain behavior of thyroid gland: Secondary | ICD-10-CM

## 2018-10-28 DIAGNOSIS — F411 Generalized anxiety disorder: Secondary | ICD-10-CM | POA: Diagnosis not present

## 2018-10-31 ENCOUNTER — Other Ambulatory Visit: Payer: Self-pay

## 2018-10-31 ENCOUNTER — Encounter: Payer: Self-pay | Admitting: Adult Health

## 2018-10-31 ENCOUNTER — Ambulatory Visit (INDEPENDENT_AMBULATORY_CARE_PROVIDER_SITE_OTHER): Payer: BC Managed Care – PPO | Admitting: Adult Health

## 2018-10-31 DIAGNOSIS — F410 Panic disorder [episodic paroxysmal anxiety] without agoraphobia: Secondary | ICD-10-CM

## 2018-10-31 DIAGNOSIS — F331 Major depressive disorder, recurrent, moderate: Secondary | ICD-10-CM

## 2018-10-31 DIAGNOSIS — G4700 Insomnia, unspecified: Secondary | ICD-10-CM

## 2018-10-31 DIAGNOSIS — F411 Generalized anxiety disorder: Secondary | ICD-10-CM | POA: Diagnosis not present

## 2018-10-31 MED ORDER — SERTRALINE HCL 50 MG PO TABS: ORAL_TABLET | ORAL | 2 refills | Status: DC

## 2018-10-31 NOTE — Progress Notes (Signed)
[name] [id number] 10/18/1955 63 y.o.  Subjective:   Patient ID:  Christine Collier is a 63 y.o. (DOB 03/12/1955) female.  Chief Complaint:  No chief complaint on file.   HPI  Christine Collier presents to the office today for follow-up of anxiety, depression, and insomnia.  Describes mood today as "not good". Pleasant. Tearful at times. Mood symptoms - reports depression, anxiety, and irritability. Stating "I don't feel like the medication has been helpful. Tapered off of Cymbalta 60mg  approximately 2 weeks ago ". Has restarted the Zoloft but does feel like she is "where she needs to be". Did "better" last week not so much this week. Taking xanx prn - "more so lately".Needs more structure - doesn't like not having a schedule. Has interest and not motivation. 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 Thyroid doctor appointment coming up. Decreased interest and motivation Taking medications as prescribed.  Energy levels vary. Active, does not have a regular exercise routine.  Enjoys some usual interests and activities. Spending time with family - husband - daughter. Son and wife building a house in [place]. Parents in [place]. Sister at [facility] - "plans to visit". Appetite adequate. Weight stable. Sleeps well most nights. Averages 6 to 8 hours. Sleeping more "lately". Focus and concentration stable. Completing tasks. Managing aspects of household.  Denies SI or HI. Denies AH or VH.  Review of Systems:  Review of Systems  Musculoskeletal: Negative for gait problem.  Neurological: Negative for tremors.  Psychiatric/Behavioral: The patient is nervous/anxious.        Please refer to HPI    Medications: I have reviewed the patient's current medications.  Current Outpatient Medications  Medication Sig Dispense Refill  . ALPRAZolam (XANAX) 0.25 MG tablet Take 1 tablet (0.25 mg total) by mouth  3 (three) times daily as needed. for anxiety 90 tablet 2  . DULoxetine (CYMBALTA) 30 MG capsule Take one capsule daily for 7 days, then discontinue. 7 capsule 0  . levothyroxine (SYNTHROID, LEVOTHROID) 75 MCG tablet Take 75 mcg by mouth daily before breakfast.    . predniSONE (DELTASONE) 20 MG tablet Take 3 tablets (60 mg total) by mouth daily. 15 tablet 0  . sertraline (ZOLOFT) 50 MG tablet Take one and 1/2 tabs daily for 7 days, then take two tablets daily. 60 tablet 2   No current facility-administered medications for this visit.     Medication Side Effects: None  Allergies:  Allergies  Allergen Reactions  . Polysporin [Bacitracin-Polymyxin B]     Past Medical History:  Diagnosis Date  . Thyroid cancer ([place])     Family History  Problem Relation Age of Onset  . Colon cancer Mother   . Autoimmune disease Neg Hx     Social History   Socioeconomic History  . Marital status: Married    Spouse name: Not on file  . Number of children: 2  . Years of education: Not on file  . Highest education level: Associate degree: academic program  Occupational History  . Not on file  Social Needs  . Financial resource strain: Not on file  . Food insecurity    Worry: Not on file    Inability: Not on file  . Transportation needs    Medical: Not on file    Non-medical: Not on file  Tobacco Use  . Smoking status: Never Smoker  . Smokeless tobacco: Never Used  Substance and Sexual Activity  . Alcohol use: Yes    Comment: social  . Drug use: Not Currently    Comment: smoked pot in past but not now  . Sexual activity: Not on file  Lifestyle  . Physical activity    Days per week: Not on file    Minutes per session: Not on file  . Stress: Not on file  Relationships  . Social Herbalist on phone: Not on file    Gets together: Not on file    Attends religious service: Not on file    Active member of club or organization: Not on file    Attends meetings of clubs or  organizations: Not on file    Relationship status: Not on file  . Intimate partner violence    Fear of current or ex partner: Not on file    Emotionally abused: Not on file    Physically abused: Not on file    Forced sexual activity: Not on file  Other Topics Concern  . Not on file  Social History Narrative   Lives at home with her husband and daughter   Right handed    Caffeine: 2 cups of coffee daily    Past Medical History, Surgical history, Social history, and Family history were reviewed and updated as appropriate.   Please see review of systems for further details on the patient's review from today.   Objective:   Physical Exam:  There were no vitals taken for this visit.  Physical Exam Constitutional:      General: She is not in acute distress.    Appearance: She is well-developed.  Musculoskeletal:        General: No deformity.  Neurological:     Mental Status: She is alert and oriented to person, place, and time.     Coordination: Coordination normal.  Psychiatric:        Attention and Perception: Attention and perception normal. She does not perceive auditory or visual hallucinations.        Mood and Affect: Mood is anxious and depressed. Affect is not labile, blunt, angry or inappropriate.        Speech: Speech normal.        Behavior: Behavior normal.        Thought Content: Thought content normal. Thought content is not paranoid or delusional. Thought content does not include homicidal or suicidal ideation. Thought content does not include homicidal or suicidal plan.        Cognition and Memory: Cognition and memory normal.        Judgment: Judgment normal.     Comments: Insight intact     Lab Review:     Component Value Date/Time   NA 143 10/10/2017 1414   K 4.4 10/10/2017 1414   CL 103 10/10/2017 1414   CO2 25 10/10/2017 1414   GLUCOSE 89 10/10/2017 1414   BUN 15 10/10/2017 1414   CREATININE 0.77 10/10/2017 1414   CALCIUM 9.4 10/10/2017 1414    PROT 6.0 10/10/2017 1414   ALBUMIN 4.0 10/10/2017 1414   AST 12 10/10/2017 1414   ALT 14 10/10/2017 1414   ALKPHOS 58 10/10/2017 1414   BILITOT 0.5 10/10/2017 1414   GFRNONAA 84 10/10/2017 1414   GFRAA 96 10/10/2017 1414       Component Value Date/Time   WBC 7.8 10/10/2017 1414   RBC 4.89 10/10/2017 1414   HGB 15.1 10/10/2017 1414   HCT 43.7 10/10/2017 1414  PLT 360 10/10/2017 1414   MCV 89 10/10/2017 1414   MCH 30.9 10/10/2017 1414   MCHC 34.6 10/10/2017 1414   RDW 12.4 10/10/2017 1414    No results found for: POCLITH, LITHIUM   No results found for: PHENYTOIN, PHENOBARB, VALPROATE, CBMZ   .res Assessment: Plan:    Plan:  1. Xanax 0.25mg  TID prn anxiety 2. Increase Zoloft 50mg  daily to 75mg  x 7 days, then 100mg  daily  Continue therapy  RTC 4 weeks  Patient advised to contact office with any questions, adverse effects, or acute worsening in signs and symptoms.  Discussed potential benefits, risk, and side effects of benzodiazepines to include potential risk of tolerance and dependence, as well as possible drowsiness.  Advised patient not to drive if experiencing drowsiness and to take lowest possible effective dose to minimize risk of dependence and tolerance.  Diagnoses and all orders for this visit:  Major depressive disorder, recurrent episode, moderate (HCC) -     sertraline (ZOLOFT) 50 MG tablet; Take one and 1/2 tabs daily for 7 days, then take two tablets daily.  Generalized anxiety disorder -     sertraline (ZOLOFT) 50 MG tablet; Take one and 1/2 tabs daily for 7 days, then take two tablets daily.  Panic attacks -     sertraline (ZOLOFT) 50 MG tablet; Take one and 1/2 tabs daily for 7 days, then take two tablets daily.  Insomnia, unspecified type -     sertraline (ZOLOFT) 50 MG tablet; Take one and 1/2 tabs daily for 7 days, then take two tablets daily.     Please see After Visit Summary for patient specific instructions.  Future Appointments   Date Time Provider [place]  11/11/2018  1:00 PM [facility] [place] [facility]    No orders of the defined types were placed in this encounter.   -------------------------------

## 2018-11-11 ENCOUNTER — Ambulatory Visit
Admission: RE | Admit: 2018-11-11 | Discharge: 2018-11-11 | Disposition: A | Discharge status: Home / Self Care | Payer: BC Managed Care – PPO | Source: Ambulatory Visit | Attending: Surgery | Admitting: Surgery

## 2018-11-11 DIAGNOSIS — E041 Nontoxic single thyroid nodule: Secondary | ICD-10-CM | POA: Diagnosis not present

## 2018-11-11 DIAGNOSIS — E042 Nontoxic multinodular goiter: Secondary | ICD-10-CM

## 2018-11-11 DIAGNOSIS — D440 Neoplasm of uncertain behavior of thyroid gland: Secondary | ICD-10-CM

## 2018-11-13 DIAGNOSIS — F411 Generalized anxiety disorder: Secondary | ICD-10-CM | POA: Diagnosis not present

## 2018-11-26 DIAGNOSIS — E042 Nontoxic multinodular goiter: Secondary | ICD-10-CM | POA: Diagnosis not present

## 2018-11-27 DIAGNOSIS — F411 Generalized anxiety disorder: Secondary | ICD-10-CM | POA: Diagnosis not present

## 2018-12-09 ENCOUNTER — Telehealth: Payer: Self-pay | Admitting: Adult Health

## 2018-12-09 DIAGNOSIS — F411 Generalized anxiety disorder: Secondary | ICD-10-CM | POA: Diagnosis not present

## 2018-12-09 NOTE — Telephone Encounter (Signed)
Pt called and LM, said has met with Juliann Pulse ( therapist??) and thinks she is not doing well with  her medications. Needs to look at adjusting them.

## 2018-12-10 ENCOUNTER — Other Ambulatory Visit: Payer: Self-pay | Admitting: Adult Health

## 2018-12-10 DIAGNOSIS — F411 Generalized anxiety disorder: Secondary | ICD-10-CM

## 2018-12-10 MED ORDER — ALPRAZOLAM 0.25 MG PO TABS: 0.2500 mg | ORAL_TABLET | Freq: Three times a day (TID) | ORAL | 2 refills | Status: DC | PRN

## 2018-12-25 ENCOUNTER — Other Ambulatory Visit: Payer: Self-pay | Admitting: Adult Health

## 2018-12-25 DIAGNOSIS — G4700 Insomnia, unspecified: Secondary | ICD-10-CM

## 2018-12-25 DIAGNOSIS — F411 Generalized anxiety disorder: Secondary | ICD-10-CM

## 2018-12-25 DIAGNOSIS — F410 Panic disorder [episodic paroxysmal anxiety] without agoraphobia: Secondary | ICD-10-CM

## 2018-12-25 DIAGNOSIS — F331 Major depressive disorder, recurrent, moderate: Secondary | ICD-10-CM

## 2018-12-25 MED ORDER — DULOXETINE HCL 30 MG PO CPEP: ORAL_CAPSULE | ORAL | 2 refills | Status: DC

## 2019-05-05 ENCOUNTER — Ambulatory Visit: Payer: BC Managed Care – PPO | Admitting: Adult Health

## 2019-05-08 ENCOUNTER — Ambulatory Visit (INDEPENDENT_AMBULATORY_CARE_PROVIDER_SITE_OTHER): Payer: Self-pay | Admitting: Adult Health

## 2019-05-08 ENCOUNTER — Encounter: Payer: Self-pay | Admitting: Adult Health

## 2019-05-08 DIAGNOSIS — F411 Generalized anxiety disorder: Secondary | ICD-10-CM

## 2019-05-08 DIAGNOSIS — F410 Panic disorder [episodic paroxysmal anxiety] without agoraphobia: Secondary | ICD-10-CM

## 2019-05-08 DIAGNOSIS — F331 Major depressive disorder, recurrent, moderate: Secondary | ICD-10-CM

## 2019-05-08 DIAGNOSIS — G4700 Insomnia, unspecified: Secondary | ICD-10-CM

## 2019-05-08 MED ORDER — ALPRAZOLAM 0.25 MG PO TABS: 0.2500 mg | ORAL_TABLET | Freq: Three times a day (TID) | ORAL | 2 refills | Status: DC | PRN

## 2019-05-08 MED ORDER — DULOXETINE HCL 30 MG PO CPEP: ORAL_CAPSULE | ORAL | 1 refills | Status: DC

## 2019-05-08 NOTE — Progress Notes (Signed)
WINSTON SINN [id number] 1955/03/10 64 y.o.  Subjective:   Patient ID:  Christine Collier is a 64 y.o. (DOB 21-Dec-1955) female.  Chief Complaint: No chief complaint on file.   HPI Christine Collier presents to the office today for follow-up of anxiety, depression, and insomnia.  Describes mood today as "not good". Pleasant. Tearful at times. Mood symptoms - reports decreased depression, anxiety, and irritability. Has tried the Cymbalta at 60mg  and then restarted Zoloft. Did not feel like either one helped. Has restarted Cymbalta 30mg  daily and feels like it has helped. Has seen regular PCP - thyroid and physical. Having difficulties getting herself "going" again. Was furlowed from job. Lost her insurance. Thinking about looking for another job. Stating "I don't do well staying home". May look for another job that offers benefits. Decreased interest and motivation Taking medications as prescribed.  Energy levels vary. Active, does not have a regular exercise routine.  Enjoys some usual interests and activities. Married. Lives with husband of 83 years and daughter - 20. Spending time with family. Son and wife building a house in [place]. Parents in [place]. Sister at [facility]. Appetite adequate. Weight stable. Sleeps well most nights. Averages 6 to 8 hours.  Focus and concentration stable. Completing tasks. Managing aspects of household.  Denies SI or HI. Denies AH or VH.  Review of Systems:  Review of Systems  Musculoskeletal: Negative for gait problem.  Neurological: Negative for tremors.  Psychiatric/Behavioral:       Please refer to HPI    Medications: I have reviewed the patient's current medications.  Current Outpatient Medications  Medication Sig Dispense Refill  . ALPRAZolam (XANAX) 0.25 MG tablet Take 1 tablet (0.25 mg total) by mouth 3 (three) times daily as needed. for anxiety 90 tablet 2  . DULoxetine (CYMBALTA) 30 MG capsule Take one capsule daily for 7  days, then 2 capsules daily. 60 capsule 2  . levothyroxine (SYNTHROID, LEVOTHROID) 75 MCG tablet Take 75 mcg by mouth daily before breakfast.    . predniSONE (DELTASONE) 20 MG tablet Take 3 tablets (60 mg total) by mouth daily. 15 tablet 0  . sertraline (ZOLOFT) 50 MG tablet Take one and 1/2 tabs daily for 7 days, then take two tablets daily. 60 tablet 2   No current facility-administered medications for this visit.    Medication Side Effects: None  Allergies:  Allergies  Allergen Reactions  . Polysporin [Bacitracin-Polymyxin B]     Past Medical History:  Diagnosis Date  . Thyroid cancer ([place])     Family History  Problem Relation Age of Onset  . Colon cancer Mother   . Autoimmune disease Neg Hx     Social History   Socioeconomic History  . Marital status: Married    Spouse name: Not on file  . Number of children: 2  . Years of education: Not on file  . Highest education level: Associate degree: academic program  Occupational History  . Not on file  Tobacco Use  . Smoking status: Never Smoker  . Smokeless tobacco: Never Used  Substance and Sexual Activity  . Alcohol use: Yes    Comment: social  . Drug use: Not Currently    Comment: smoked pot in past but not now  . Sexual activity: Not on file  Other Topics Concern  . Not on file  Social History Narrative   Lives at home with her husband and daughter   Right handed    Caffeine: 2 cups of coffee  daily   Social Determinants of Health   Financial Resource Strain:   . Difficulty of Paying Living Expenses:   Food Insecurity:   . Worried About Charity fundraiser in the Last Year:   . Arboriculturist in the Last Year:   Transportation Needs:   . Film/video editor (Medical):   Marland Kitchen Lack of Transportation (Non-Medical):   Physical Activity:   . Days of Exercise per Week:   . Minutes of Exercise per Session:   Stress:   . Feeling of Stress :   Social Connections:   . Frequency of Communication with Friends  and Family:   . Frequency of Social Gatherings with Friends and Family:   . Attends Religious Services:   . Active Member of Clubs or Organizations:   . Attends Archivist Meetings:   Marland Kitchen Marital Status:   Intimate Partner Violence:   . Fear of Current or Ex-Partner:   . Emotionally Abused:   Marland Kitchen Physically Abused:   . Sexually Abused:     Past Medical History, Surgical history, Social history, and Family history were reviewed and updated as appropriate.   Please see review of systems for further details on the patient's review from today.   Objective:   Physical Exam:  There were no vitals taken for this visit.  Physical Exam Neurological:     Mental Status: Christine Collier is alert and oriented to person, place, and time.     Cranial Nerves: No dysarthria.  Psychiatric:        Attention and Perception: Attention and perception normal.        Mood and Affect: Mood normal.        Speech: Speech normal.        Behavior: Behavior is cooperative.        Thought Content: Thought content normal. Thought content is not paranoid or delusional. Thought content does not include homicidal or suicidal ideation. Thought content does not include homicidal or suicidal plan.        Cognition and Memory: Cognition and memory normal.        Judgment: Judgment normal.     Comments: Insight intact     Lab Review:     Component Value Date/Time   NA 143 10/10/2017 1414   K 4.4 10/10/2017 1414   CL 103 10/10/2017 1414   CO2 25 10/10/2017 1414   GLUCOSE 89 10/10/2017 1414   BUN 15 10/10/2017 1414   CREATININE 0.77 10/10/2017 1414   CALCIUM 9.4 10/10/2017 1414   PROT 6.0 10/10/2017 1414   ALBUMIN 4.0 10/10/2017 1414   AST 12 10/10/2017 1414   ALT 14 10/10/2017 1414   ALKPHOS 58 10/10/2017 1414   BILITOT 0.5 10/10/2017 1414   GFRNONAA 84 10/10/2017 1414   GFRAA 96 10/10/2017 1414       Component Value Date/Time   WBC 7.8 10/10/2017 1414   RBC 4.89 10/10/2017 1414   HGB 15.1 10/10/2017  1414   HCT 43.7 10/10/2017 1414   PLT 360 10/10/2017 1414   MCV 89 10/10/2017 1414   MCH 30.9 10/10/2017 1414   MCHC 34.6 10/10/2017 1414   RDW 12.4 10/10/2017 1414    No results found for: POCLITH, LITHIUM   No results found for: PHENYTOIN, PHENOBARB, VALPROATE, CBMZ   .res Assessment: Plan:    Plan:  1. Xanax 0.25mg  TID prn anxiety 2. Cymbalta 30mg  daily  Continue therapy  RTC 3 months  Patient advised to contact office  with any questions, adverse effects, or acute worsening in signs and symptoms.  Discussed potential benefits, risk, and side effects of benzodiazepines to include potential risk of tolerance and dependence, as well as possible drowsiness.  Advised patient not to drive if experiencing drowsiness and to take lowest possible effective dose to minimize risk of dependence and tolerance.   There are no diagnoses linked to this encounter.   Please see After Visit Summary for patient specific instructions.  Future Appointments  Date Time Provider [place]  05/08/2019 11:20 AM Clara Herbison, Berdie Ogren, NP [facility] None    No orders of the defined types were placed in this encounter.   -------------------------------

## 2019-06-14 IMAGING — US US FNA BIOPSY THYROID 1ST LESION
1 series · 13 of 18 positions shown · non-contrast
Comparison: US Thyroid [REDACTED] 02/18/18

MEDICATIONS:
5 cc 1% lidocaine

COMPLICATIONS:
None immediate.

INDICATION: Indeterminate thyroid nodule

Left lower pole thyroid nodule
3.3 cm
History papillary carcinoma 20 years ago
EXAM:
ULTRASOUND GUIDED FINE NEEDLE ASPIRATION OF INDETERMINATE THYROID
NODULE
TECHNIQUE: Informed written consent was obtained from the patient after a
discussion of the risks, benefits and alternatives to treatment.
Questions regarding the procedure were encouraged and answered. A
timeout was performed prior to the initiation of the procedure.

[Series 1: us fna biopsy thyroid 1st lesion · 0.06mm/px · 18 acquisitions, 13 frames shown]
[im 1/18]
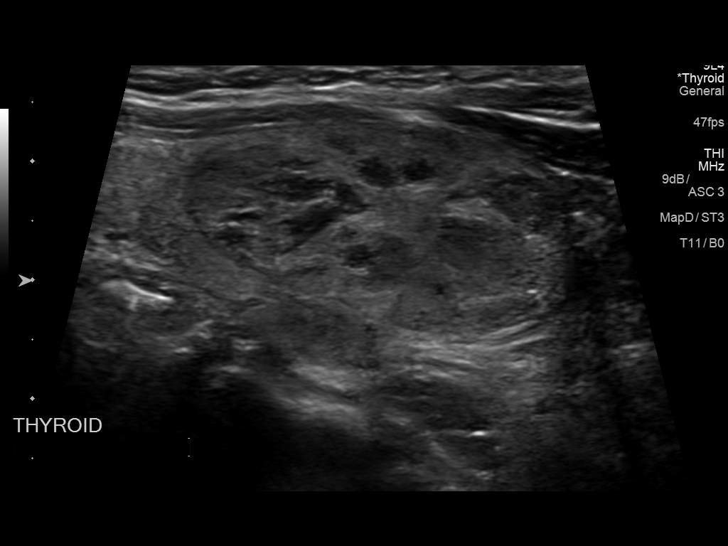
[im 3/18]
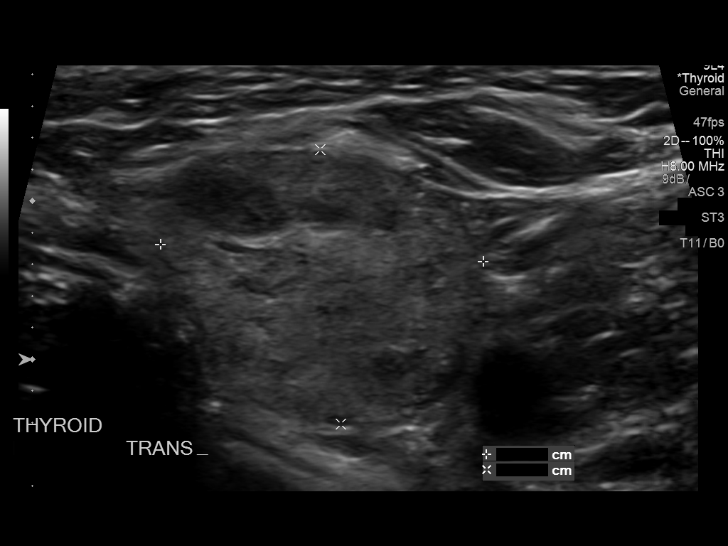
[im 4/18]
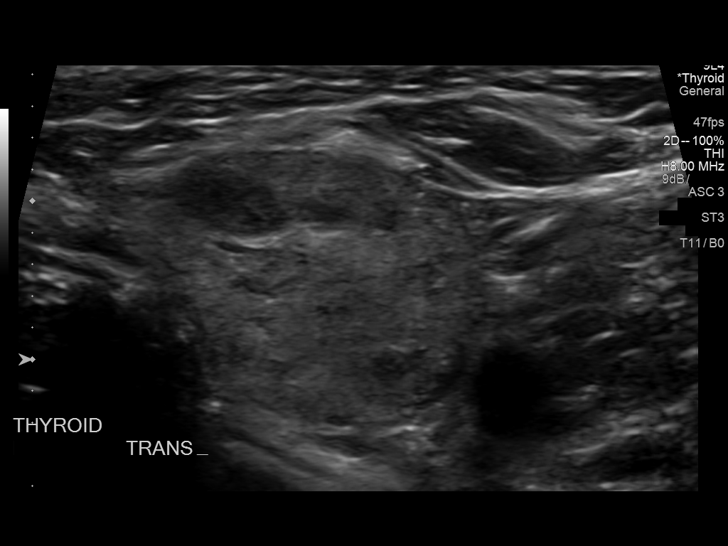
[im 5/18]
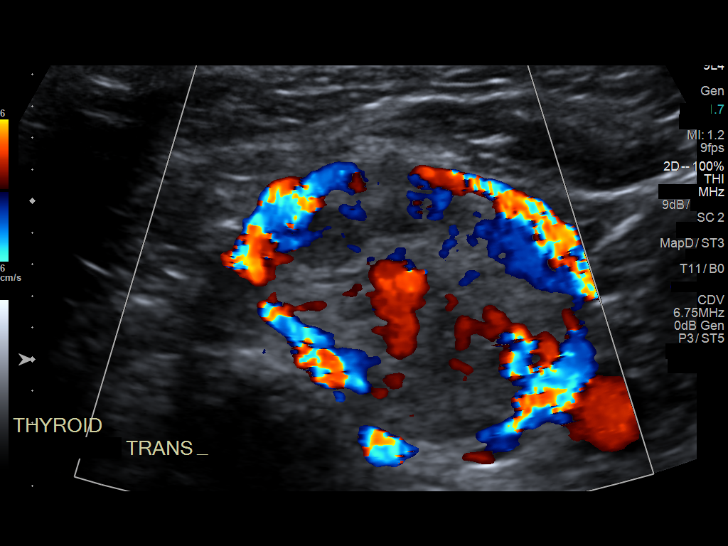
[im 7/18]
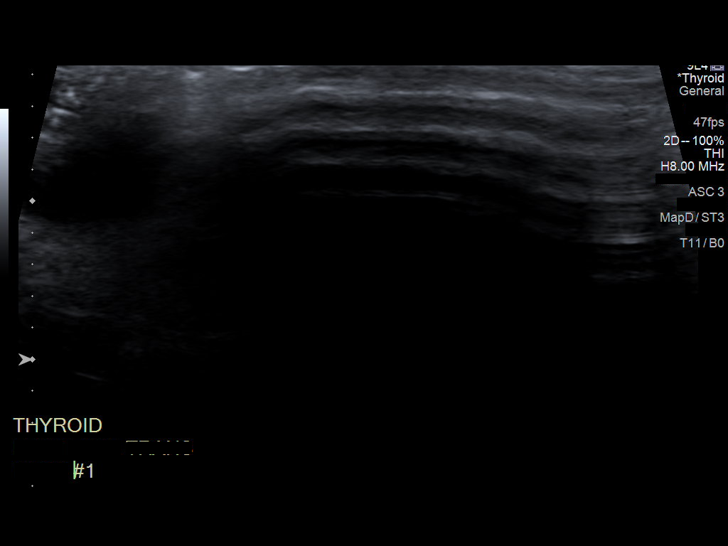
[im 8/18]
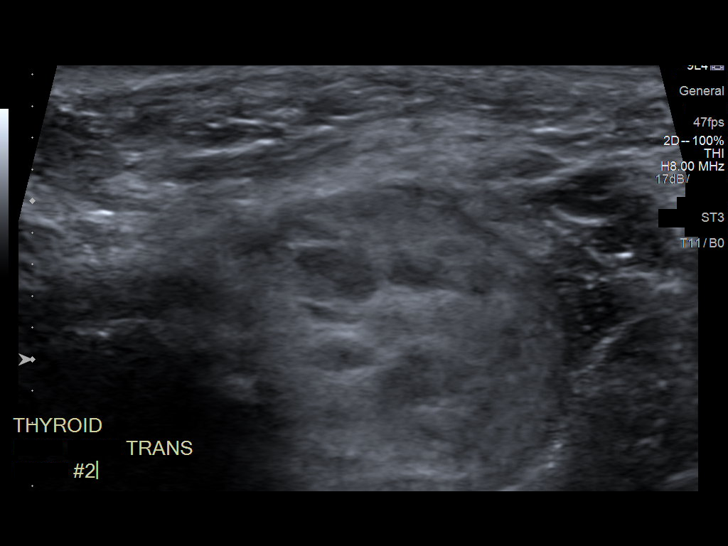
[im 10/18]
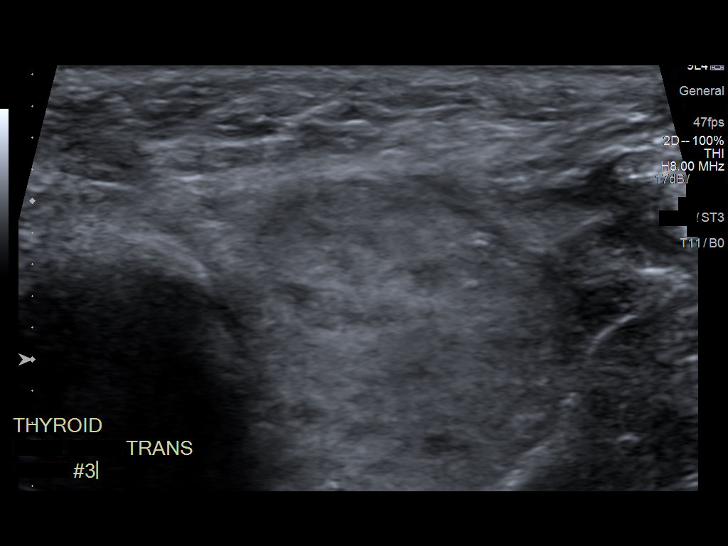
[im 11/18]
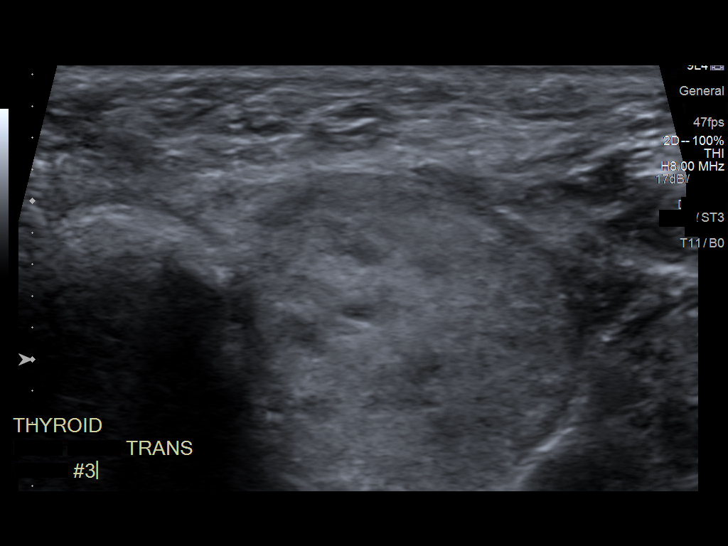
[im 12/18]
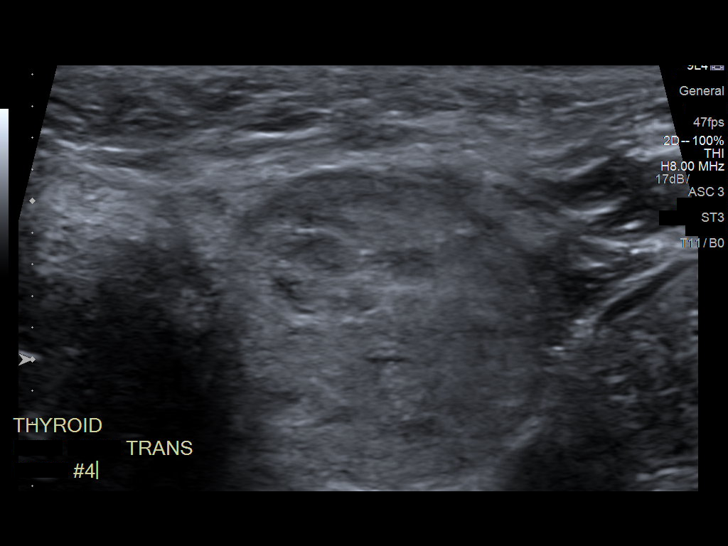
[im 14/18]
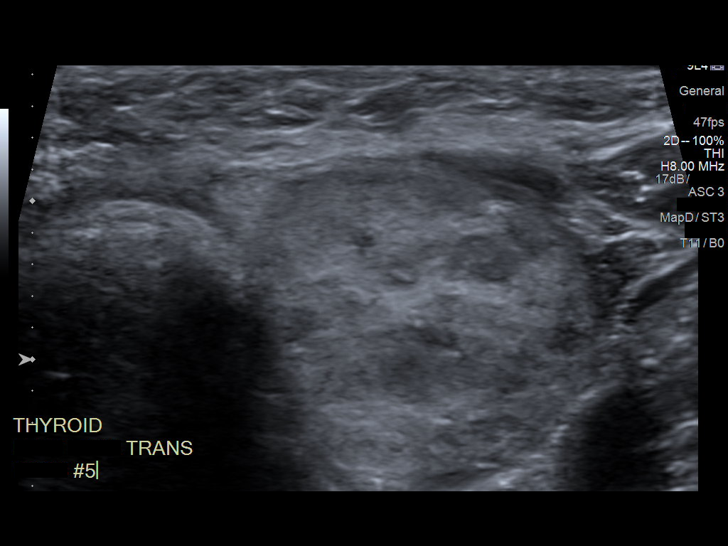
[im 15/18]
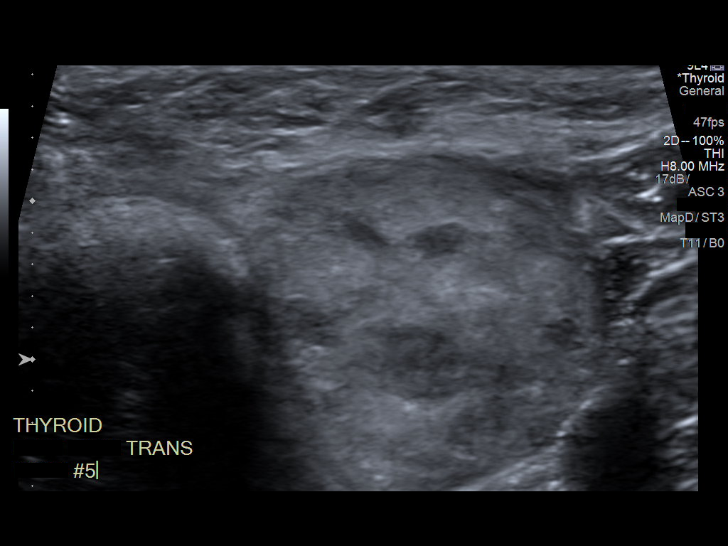
[im 16/18]
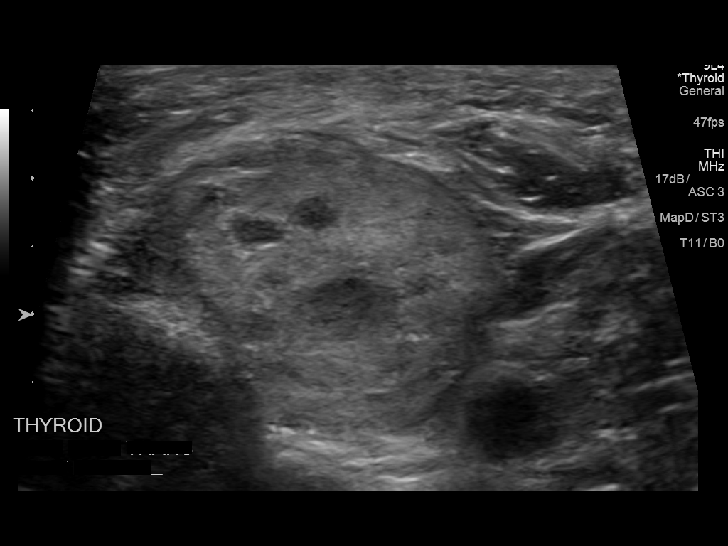
[im 18/18]
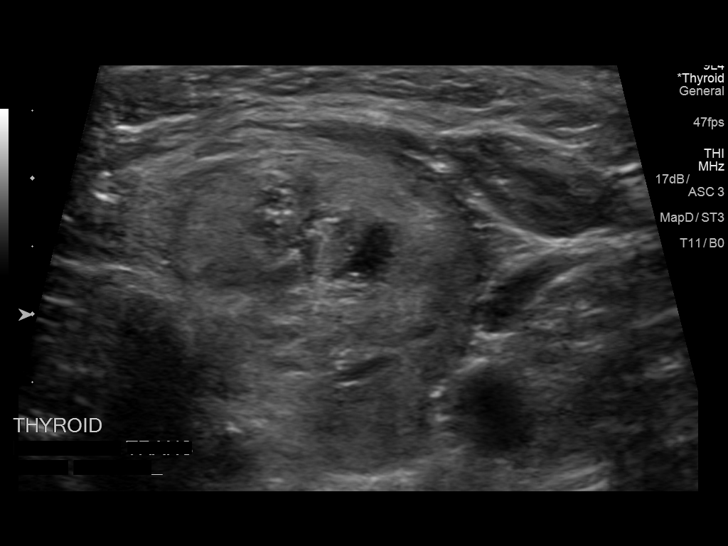

[13 of 18 positions shown; findings below may reference images not displayed]

Pre-procedural ultrasound scanning demonstrated unchanged size and
appearance of the indeterminate nodule within the left thyroid
nodule

The procedure was planned. The neck was prepped in the usual sterile
fashion, and a sterile drape was applied covering the operative
field. A timeout was performed prior to the initiation of the
procedure. Local anesthesia was provided with 1% lidocaine.

Under direct ultrasound guidance, 5 FNA biopsies were performed of
the left lower pole thyroid nodule with a 25 gauge needle.

2 of these samples were obtained for AFIRMA per ordering [facility]

Multiple ultrasound images were saved for procedural documentation
purposes. The samples were prepared and submitted to pathology.

Limited post procedural scanning was negative for hematoma or
additional complication. Dressings were placed. The patient
tolerated the above procedures procedure well without immediate
postprocedural complication.
FINDINGS: Nodule reference number based on prior diagnostic ultrasound:

Maximum size: 3.3 cm

Location: Left; Inferior

ACR TI-RADS risk category:

Reason for biopsy: HX Papillary carcinoma

Ultrasound imaging confirms appropriate placement of the needles
within the thyroid nodule.
IMPRESSION: Technically successful ultrasound guided fine needle aspiration of
left lower pole thyroid nodule

Read by

Abi Rave

## 2019-08-04 ENCOUNTER — Other Ambulatory Visit: Payer: Self-pay

## 2019-08-04 ENCOUNTER — Telehealth: Payer: Self-pay | Admitting: Adult Health

## 2019-08-04 DIAGNOSIS — F410 Panic disorder [episodic paroxysmal anxiety] without agoraphobia: Secondary | ICD-10-CM

## 2019-08-04 DIAGNOSIS — F331 Major depressive disorder, recurrent, moderate: Secondary | ICD-10-CM

## 2019-08-04 DIAGNOSIS — G4700 Insomnia, unspecified: Secondary | ICD-10-CM

## 2019-08-04 DIAGNOSIS — F411 Generalized anxiety disorder: Secondary | ICD-10-CM

## 2019-08-04 MED ORDER — DULOXETINE HCL 30 MG PO CPEP: ORAL_CAPSULE | ORAL | 0 refills | Status: DC

## 2019-08-04 MED ORDER — DULOXETINE HCL 20 MG PO CPEP: ORAL_CAPSULE | ORAL | 0 refills | Status: DC

## 2019-08-04 NOTE — Telephone Encounter (Signed)
Pt called to request refill for Cymbalta 20 mg @ [facility]   Completely out and not sure when she will be back. [phone] pt #   Apt due this month

## 2019-08-04 NOTE — Telephone Encounter (Signed)
Confirmed with patient taking Cymbalta 20 mg not 30 mg as noted in chart. Sent updated Rx to [facility]

## 2019-10-27 ENCOUNTER — Other Ambulatory Visit: Payer: Self-pay | Admitting: Surgery

## 2019-10-27 DIAGNOSIS — D440 Neoplasm of uncertain behavior of thyroid gland: Secondary | ICD-10-CM

## 2019-10-27 DIAGNOSIS — E042 Nontoxic multinodular goiter: Secondary | ICD-10-CM

## 2019-12-17 ENCOUNTER — Other Ambulatory Visit: Payer: Self-pay | Admitting: Internal Medicine

## 2019-12-17 DIAGNOSIS — Z1231 Encounter for screening mammogram for malignant neoplasm of breast: Secondary | ICD-10-CM

## 2019-12-25 ENCOUNTER — Other Ambulatory Visit: Payer: Self-pay | Admitting: Adult Health

## 2019-12-25 DIAGNOSIS — F331 Major depressive disorder, recurrent, moderate: Secondary | ICD-10-CM

## 2019-12-25 DIAGNOSIS — F410 Panic disorder [episodic paroxysmal anxiety] without agoraphobia: Secondary | ICD-10-CM

## 2019-12-25 DIAGNOSIS — F411 Generalized anxiety disorder: Secondary | ICD-10-CM

## 2019-12-25 DIAGNOSIS — G4700 Insomnia, unspecified: Secondary | ICD-10-CM

## 2019-12-31 ENCOUNTER — Other Ambulatory Visit: Payer: Self-pay | Admitting: Adult Health

## 2019-12-31 DIAGNOSIS — G4700 Insomnia, unspecified: Secondary | ICD-10-CM

## 2019-12-31 DIAGNOSIS — F411 Generalized anxiety disorder: Secondary | ICD-10-CM

## 2019-12-31 DIAGNOSIS — F410 Panic disorder [episodic paroxysmal anxiety] without agoraphobia: Secondary | ICD-10-CM

## 2019-12-31 DIAGNOSIS — F331 Major depressive disorder, recurrent, moderate: Secondary | ICD-10-CM

## 2019-12-31 MED ORDER — ALPRAZOLAM 0.25 MG PO TABS: 0.2500 mg | ORAL_TABLET | Freq: Three times a day (TID) | ORAL | 2 refills | Status: DC | PRN

## 2019-12-31 MED ORDER — DULOXETINE HCL 20 MG PO CPEP: ORAL_CAPSULE | ORAL | 0 refills | Status: DC

## 2019-12-31 NOTE — Telephone Encounter (Signed)
Script sent  

## 2019-12-31 NOTE — Telephone Encounter (Signed)
Pt requesting refills for Cymbalta & Xanax @ [place] on file. Apt 01/14/20

## 2020-01-01 ENCOUNTER — Telehealth: Payer: Self-pay | Admitting: Adult Health

## 2020-01-12 NOTE — Telephone Encounter (Signed)
error 

## 2020-01-14 ENCOUNTER — Encounter: Payer: Self-pay | Admitting: Adult Health

## 2020-01-14 ENCOUNTER — Telehealth (INDEPENDENT_AMBULATORY_CARE_PROVIDER_SITE_OTHER): Payer: Self-pay | Admitting: Adult Health

## 2020-01-14 DIAGNOSIS — F410 Panic disorder [episodic paroxysmal anxiety] without agoraphobia: Secondary | ICD-10-CM

## 2020-01-14 DIAGNOSIS — F331 Major depressive disorder, recurrent, moderate: Secondary | ICD-10-CM

## 2020-01-14 DIAGNOSIS — G4700 Insomnia, unspecified: Secondary | ICD-10-CM

## 2020-01-14 DIAGNOSIS — F411 Generalized anxiety disorder: Secondary | ICD-10-CM

## 2020-01-14 NOTE — Progress Notes (Signed)
Christine Collier [id number] 08/20/1955 65 y.o.  Virtual Visit via Video Note  I connected with pt @ on 01/14/20 at  9:40 AM EST by a video enabled telemedicine application and verified that I am speaking with the correct person using two identifiers.   I discussed the limitations of evaluation and management by telemedicine and the availability of in person appointments. The patient expressed understanding and agreed to proceed.  I discussed the assessment and treatment plan with the patient. The patient was provided an opportunity to ask questions and all were answered. The patient agreed with the plan and demonstrated an understanding of the instructions.   The patient was advised to call back or seek an in-person evaluation if the symptoms worsen or if the condition fails to improve as anticipated.  I provided 30 minutes of non-face-to-face time during this encounter.  The patient was located at home.  The provider was located at [place].   Aloha Gell, NP   Subjective:   Patient ID:  Christine Collier is a 65 y.o. (DOB 12-19-1955) female.  Chief Complaint: No chief complaint on file.   HPI Christine Collier presents for follow-up of anxiety, depression, and insomnia.  Describes mood today as "ok". Pleasant. Denies tearfulness. Mood symptoms - reports decreased depression, anxiety, and irritability. Denies panic attacks. Stating "I'm doing alright". Feels like Cymbalta at 30mg  daily works well for her. Using Xanax as needed - "not taking every day". Working with PCP - thyroid. Has started working at [employer] - will have insurance in the next few months. Decreased interest and motivation Taking medications as prescribed.  Energy levels stable. Active, does not have a regular exercise routine.  Enjoys some usual interests and activities. Married. Lives with husband of 63 years. Daughter lives with her - apartment sitting. Son and wife building a house in  [place]. Parents in [place]. Sister at [employer]. Spending time with family Appetite adequate. Weight gain - 10 pounds. Sleeps well most nights. Having weird dreams - feels "active" in her dreams. Averages 6 to 8 hours.  Focus and concentration stable. Completing tasks. Managing aspects of household. Work going well.  Denies SI or HI.  Denies AH or VH.  Review of Systems:  Review of Systems  Musculoskeletal: Negative for gait problem.  Neurological: Negative for tremors.  Psychiatric/Behavioral:       Please refer to HPI    Medications: I have reviewed the patient's current medications.  Current Outpatient Medications  Medication Sig Dispense Refill  . ALPRAZolam (XANAX) 0.25 MG tablet Take 1 tablet (0.25 mg total) by mouth 3 (three) times daily as needed. 90 tablet 2  . DULoxetine (CYMBALTA) 30 MG capsule Take 1 capsule by mouth once daily 90 capsule 0  . levothyroxine (SYNTHROID, LEVOTHROID) 75 MCG tablet Take 75 mcg by mouth daily before breakfast.    . predniSONE (DELTASONE) 20 MG tablet Take 3 tablets (60 mg total) by mouth daily. 15 tablet 0   No current facility-administered medications for this visit.    Medication Side Effects: None  Allergies:  Allergies  Allergen Reactions  . Polysporin [Bacitracin-Polymyxin B]     Past Medical History:  Diagnosis Date  . Thyroid cancer ([place])     Family History  Problem Relation Age of Onset  . Colon cancer Mother   . Autoimmune disease Neg Hx     Social History   Socioeconomic History  . Marital status: Married    Spouse name: Not on  file  . Number of children: 2  . Years of education: Not on file  . Highest education level: Associate degree: academic program  Occupational History  . Not on file  Tobacco Use  . Smoking status: Never Smoker  . Smokeless tobacco: Never Used  Vaping Use  . Vaping Use: Never used  Substance and Sexual Activity  . Alcohol use: Yes    Comment: social  . Drug use:  Not Currently    Comment: smoked pot in past but not now  . Sexual activity: Not on file  Other Topics Concern  . Not on file  Social History Narrative   Lives at home with her husband and daughter   Right handed    Caffeine: 2 cups of coffee daily   Social Determinants of Health   Financial Resource Strain: Not on file  Food Insecurity: Not on file  Transportation Needs: Not on file  Physical Activity: Not on file  Stress: Not on file  Social Connections: Not on file  Intimate Partner Violence: Not on file    Past Medical History, Surgical history, Social history, and Family history were reviewed and updated as appropriate.   Please see review of systems for further details on the patient's review from today.   Objective:   Physical Exam:  There were no vitals taken for this visit.  Physical Exam Constitutional:      General: She is not in acute distress. Musculoskeletal:        General: No deformity.  Neurological:     Mental Status: She is alert and oriented to person, place, and time.     Cranial Nerves: No dysarthria.     Coordination: Coordination normal.  Psychiatric:        Attention and Perception: Attention and perception normal. She does not perceive auditory or visual hallucinations.        Mood and Affect: Mood normal. Mood is not anxious or depressed. Affect is not labile, blunt, angry or inappropriate.        Speech: Speech normal.        Behavior: Behavior normal. Behavior is cooperative.        Thought Content: Thought content normal. Thought content is not paranoid or delusional. Thought content does not include homicidal or suicidal ideation. Thought content does not include homicidal or suicidal plan.        Cognition and Memory: Cognition and memory normal.        Judgment: Judgment normal.     Comments: Insight intact     Lab Review:     Component Value Date/Time   NA 143 10/10/2017 1414   K 4.4 10/10/2017 1414   CL 103 10/10/2017 1414    CO2 25 10/10/2017 1414   GLUCOSE 89 10/10/2017 1414   BUN 15 10/10/2017 1414   CREATININE 0.77 10/10/2017 1414   CALCIUM 9.4 10/10/2017 1414   PROT 6.0 10/10/2017 1414   ALBUMIN 4.0 10/10/2017 1414   AST 12 10/10/2017 1414   ALT 14 10/10/2017 1414   ALKPHOS 58 10/10/2017 1414   BILITOT 0.5 10/10/2017 1414   GFRNONAA 84 10/10/2017 1414   GFRAA 96 10/10/2017 1414       Component Value Date/Time   WBC 7.8 10/10/2017 1414   RBC 4.89 10/10/2017 1414   HGB 15.1 10/10/2017 1414   HCT 43.7 10/10/2017 1414   PLT 360 10/10/2017 1414   MCV 89 10/10/2017 1414   MCH 30.9 10/10/2017 1414   MCHC 34.6 10/10/2017  1414   RDW 12.4 10/10/2017 1414    No results found for: POCLITH, LITHIUM   No results found for: PHENYTOIN, PHENOBARB, VALPROATE, CBMZ   .res Assessment: Plan:    Plan:  1. Xanax 0.25mg  - TID prn anxiety 2. Cymbalta 30mg  daily  Continue therapy  RTC 6 months  Patient advised to contact office with any questions, adverse effects, or acute worsening in signs and symptoms.  Discussed potential benefits, risk, and side effects of benzodiazepines to include potential risk of tolerance and dependence, as well as possible drowsiness.  Advised patient not to drive if experiencing drowsiness and to take lowest possible effective dose to minimize risk of dependence and tolerance.  Diagnoses and all orders for this visit:  Generalized anxiety disorder  Major depressive disorder, recurrent episode, moderate (HCC)  Panic attacks  Insomnia, unspecified type     Please see After Visit Summary for patient specific instructions.  No future appointments.  No orders of the defined types were placed in this encounter.     -------------------------------

## 2020-04-08 ENCOUNTER — Other Ambulatory Visit: Payer: Self-pay | Admitting: Adult Health

## 2020-04-08 DIAGNOSIS — F410 Panic disorder [episodic paroxysmal anxiety] without agoraphobia: Secondary | ICD-10-CM

## 2020-04-08 DIAGNOSIS — G4700 Insomnia, unspecified: Secondary | ICD-10-CM

## 2020-04-08 DIAGNOSIS — F411 Generalized anxiety disorder: Secondary | ICD-10-CM

## 2020-04-08 DIAGNOSIS — F331 Major depressive disorder, recurrent, moderate: Secondary | ICD-10-CM

## 2020-06-03 ENCOUNTER — Other Ambulatory Visit: Payer: Self-pay | Admitting: Internal Medicine

## 2020-06-03 DIAGNOSIS — Z1231 Encounter for screening mammogram for malignant neoplasm of breast: Secondary | ICD-10-CM

## 2020-07-02 ENCOUNTER — Other Ambulatory Visit: Payer: Self-pay | Admitting: Adult Health

## 2020-07-02 DIAGNOSIS — F411 Generalized anxiety disorder: Secondary | ICD-10-CM

## 2020-07-02 NOTE — Telephone Encounter (Signed)
Pt due back now for 6 month f/u     ADM: schedule apt for pt for f/u with [facility]

## 2020-07-07 NOTE — Telephone Encounter (Signed)
Called Pt to schedule. She was @ work and to call back to schedule.

## 2020-08-20 ENCOUNTER — Other Ambulatory Visit: Payer: Self-pay | Admitting: Adult Health

## 2020-08-20 DIAGNOSIS — F411 Generalized anxiety disorder: Secondary | ICD-10-CM

## 2020-08-20 DIAGNOSIS — G4700 Insomnia, unspecified: Secondary | ICD-10-CM

## 2020-08-20 DIAGNOSIS — F331 Major depressive disorder, recurrent, moderate: Secondary | ICD-10-CM

## 2020-08-20 DIAGNOSIS — F410 Panic disorder [episodic paroxysmal anxiety] without agoraphobia: Secondary | ICD-10-CM

## 2020-08-31 ENCOUNTER — Ambulatory Visit: Payer: Self-pay | Admitting: Adult Health

## 2020-08-31 NOTE — Progress Notes (Signed)
Patient no show appointment. ? ?

## 2020-09-16 ENCOUNTER — Other Ambulatory Visit: Payer: Self-pay

## 2020-09-16 ENCOUNTER — Ambulatory Visit (INDEPENDENT_AMBULATORY_CARE_PROVIDER_SITE_OTHER): Payer: 59 | Admitting: Adult Health

## 2020-09-16 ENCOUNTER — Encounter: Payer: Self-pay | Admitting: Adult Health

## 2020-09-16 DIAGNOSIS — G4700 Insomnia, unspecified: Secondary | ICD-10-CM | POA: Diagnosis not present

## 2020-09-16 DIAGNOSIS — F411 Generalized anxiety disorder: Secondary | ICD-10-CM

## 2020-09-16 DIAGNOSIS — F331 Major depressive disorder, recurrent, moderate: Secondary | ICD-10-CM | POA: Diagnosis not present

## 2020-09-16 MED ORDER — DULOXETINE HCL 30 MG PO CPEP: 30.0000 mg | ORAL_CAPSULE | Freq: Every day | ORAL | 3 refills | Status: DC

## 2020-09-16 MED ORDER — ALPRAZOLAM 0.25 MG PO TABS: 0.2500 mg | ORAL_TABLET | Freq: Three times a day (TID) | ORAL | 0 refills | Status: DC | PRN

## 2020-09-16 NOTE — Progress Notes (Signed)
Patient no show appointment.Christine Collier [id number] [birth year] 65 y.o.  Subjective:   Patient ID:  Christine Collier is a 65 y.o. (DOB [birth year]) female.  Chief Complaint: No chief complaint on file.   HPI Christine Collier presents to the office today for follow-up of anxiety, depression, and insomnia.  Describes mood today as "ok". Pleasant. Denies tearfulness. Mood symptoms - reports decreased depression, anxiety, and irritability. Denies panic attacks. Stating "I've been doing pretty good". Feels like medications - Cymbalta and Xanax continue to work well for her. Seeing PCP regularly. Stable interest and motivation Taking medications as prescribed.  Energy levels stable. Active, does not have a regular exercise routine.  Enjoys some usual interests and activities. Married. Lives with husband of 18 years. Children doing well. Parents in [place]. Sister at [facility] [employer] [facility]. Spending time with family Appetite adequate. Weight loss - 10 pounds. Sleeps well most nights. Reports vivid dreams. Averages 6 to 8 hours.  Focus and concentration stable. Completing tasks. Managing aspects of household. Work going well - 24 hours a week Denies SI or HI.  Denies AH or VH.  Review of Systems:  Review of Systems  Musculoskeletal:  Negative for gait problem.  Neurological:  Negative for tremors.  Psychiatric/Behavioral:         Please refer to HPI   Medications: I have reviewed the patient's current medications.  Current Outpatient Medications  Medication Sig Dispense Refill   ALPRAZolam (XANAX) 0.25 MG tablet Take 1 tablet (0.25 mg total) by mouth 3 (three) times daily as needed. 90 tablet 0   DULoxetine (CYMBALTA) 30 MG capsule Take 1 capsule (30 mg total) by mouth daily. 90 capsule 3   levothyroxine (SYNTHROID, LEVOTHROID) 75 MCG tablet Take 75 mcg by mouth daily before breakfast.     predniSONE (DELTASONE) 20 MG tablet Take 3 tablets (60 mg total) by mouth daily. 15 tablet 0    No current facility-administered medications for this visit.    Medication Side Effects: None  Allergies:  Allergies  Allergen Reactions   Polysporin [Bacitracin-Polymyxin B]     Past Medical History:  Diagnosis Date   Thyroid cancer [facility])     Past Medical History, Surgical history, Social history, and Family history were reviewed and updated as appropriate.   Please see review of systems for further details on the patient's review from today.   Objective:   Physical Exam:  There were no vitals taken for this visit.  Physical Exam Constitutional:      General: She is not in acute distress. Musculoskeletal:        General: No deformity.  Neurological:     Mental Status: She is alert and oriented to person, place, and time.     Coordination: Coordination normal.  Psychiatric:        Attention and Perception: Attention and perception normal. She does not perceive auditory or visual hallucinations.        Mood and Affect: Mood normal. Mood is not anxious or depressed. Affect is not labile, blunt, angry or inappropriate.        Speech: Speech normal.        Behavior: Behavior normal.        Thought Content: Thought content normal. Thought content is not paranoid or delusional. Thought content does not include homicidal or suicidal ideation. Thought content does not include homicidal or suicidal plan.        Cognition and Memory: Cognition and memory normal.  Judgment: Judgment normal.     Comments: Insight intact    Lab Review:     Component Value Date/Time   NA 143 10/10/2017 1414   K 4.4 10/10/2017 1414   CL 103 10/10/2017 1414   CO2 25 10/10/2017 1414   GLUCOSE 89 10/10/2017 1414   BUN 15 10/10/2017 1414   CREATININE 0.77 10/10/2017 1414   CALCIUM 9.4 10/10/2017 1414   PROT 6.0 10/10/2017 1414   ALBUMIN 4.0 10/10/2017 1414   AST 12 10/10/2017 1414   ALT 14 10/10/2017 1414   ALKPHOS 58 10/10/2017 1414   BILITOT 0.5 10/10/2017 1414   GFRNONAA 84  10/10/2017 1414   GFRAA 96 10/10/2017 1414       Component Value Date/Time   WBC 7.8 10/10/2017 1414   RBC 4.89 10/10/2017 1414   HGB 15.1 10/10/2017 1414   HCT 43.7 10/10/2017 1414   PLT 360 10/10/2017 1414   MCV 89 10/10/2017 1414   MCH 30.9 10/10/2017 1414   MCHC 34.6 10/10/2017 1414   RDW 12.4 10/10/2017 1414    No results found for: POCLITH, LITHIUM   No results found for: PHENYTOIN, PHENOBARB, VALPROATE, CBMZ   .res Assessment: Plan:    Plan:  1. Xanax 0.'25mg'$  - TID prn anxiety 2. Cymbalta '30mg'$  daily  Continue therapy  RTC 6 months  Patient advised to contact office with any questions, adverse effects, or acute worsening in signs and symptoms.  Discussed potential benefits, risk, and side effects of benzodiazepines to include potential risk of tolerance and dependence, as well as possible drowsiness.  Advised patient not to drive if experiencing drowsiness and to take lowest possible effective dose to minimize risk of dependence and tolerance.   Diagnoses and all orders for this visit:  Generalized anxiety disorder -     ALPRAZolam (XANAX) 0.25 MG tablet; Take 1 tablet (0.25 mg total) by mouth 3 (three) times daily as needed. -     DULoxetine (CYMBALTA) 30 MG capsule; Take 1 capsule (30 mg total) by mouth daily.  Major depressive disorder, recurrent episode, moderate (HCC) -     DULoxetine (CYMBALTA) 30 MG capsule; Take 1 capsule (30 mg total) by mouth daily.  Insomnia, unspecified type -     DULoxetine (CYMBALTA) 30 MG capsule; Take 1 capsule (30 mg total) by mouth daily.    Please see After Visit Summary for patient specific instructions.  No future appointments.  No orders of the defined types were placed in this encounter.   -------------------------------

## 2020-11-24 ENCOUNTER — Other Ambulatory Visit: Payer: Self-pay | Admitting: Adult Health

## 2020-11-24 DIAGNOSIS — G4700 Insomnia, unspecified: Secondary | ICD-10-CM

## 2020-11-24 DIAGNOSIS — F411 Generalized anxiety disorder: Secondary | ICD-10-CM

## 2020-11-24 DIAGNOSIS — F331 Major depressive disorder, recurrent, moderate: Secondary | ICD-10-CM

## 2021-01-18 IMAGING — US US THYROID
1 series · 13 of 25 positions shown · non-contrast
Comparison: 03/07/2018

CLINICAL DATA: Thyroid nodule follow-up

EXAM:
THYROID ULTRASOUND
TECHNIQUE: Ultrasound examination of the thyroid gland and adjacent soft
tissues was performed.

[Series 1: us thyroid · 0.05mm/px · 13 of 39 slices shown]
[im 1/39]
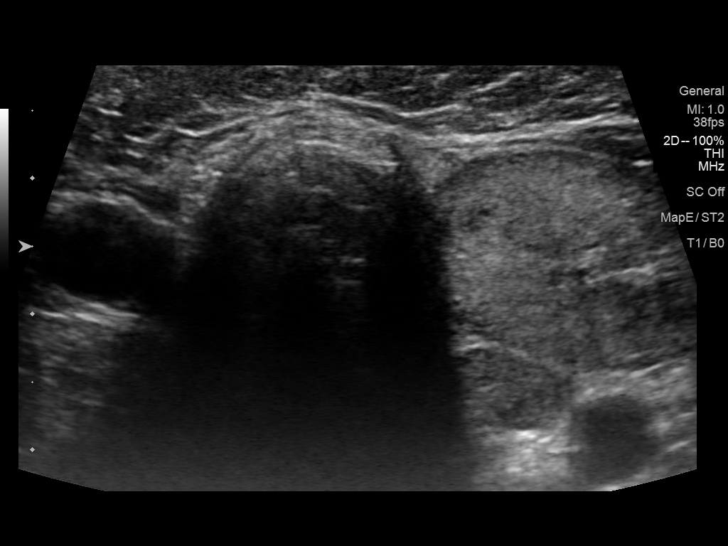
[im 4/39]
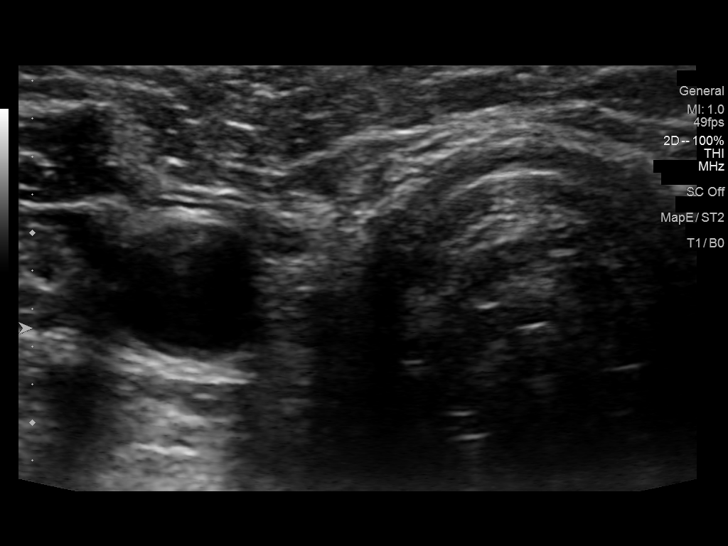
[im 7/39]
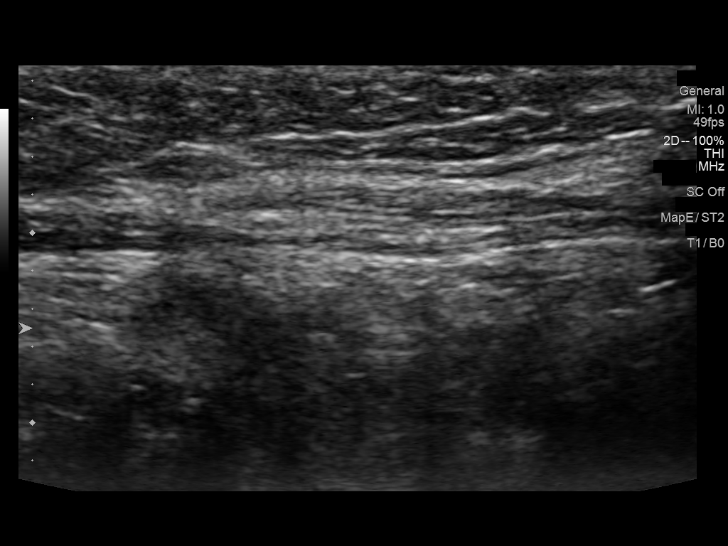
[im 10/39]
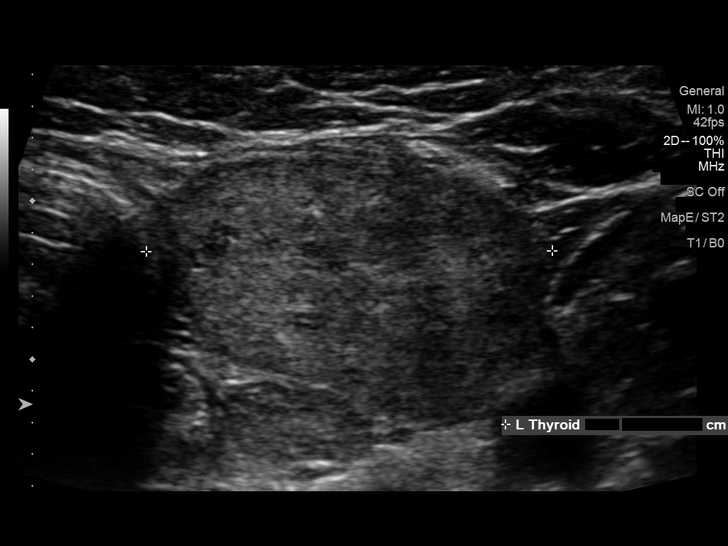
[im 13/39]
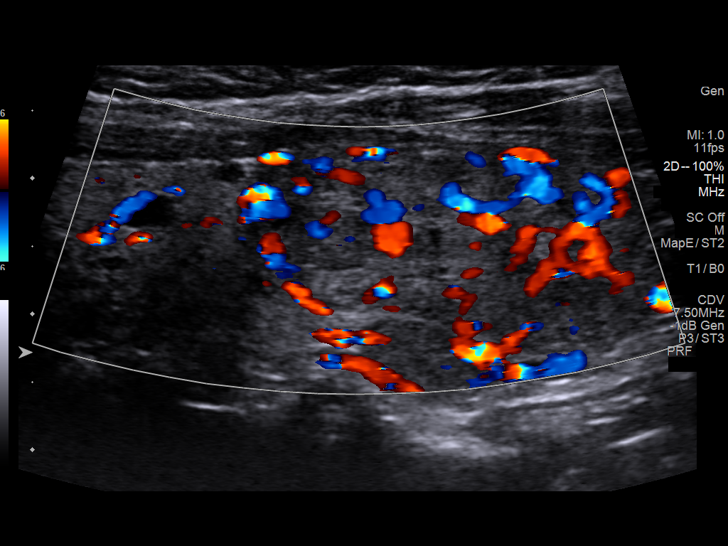
[im 16/39]
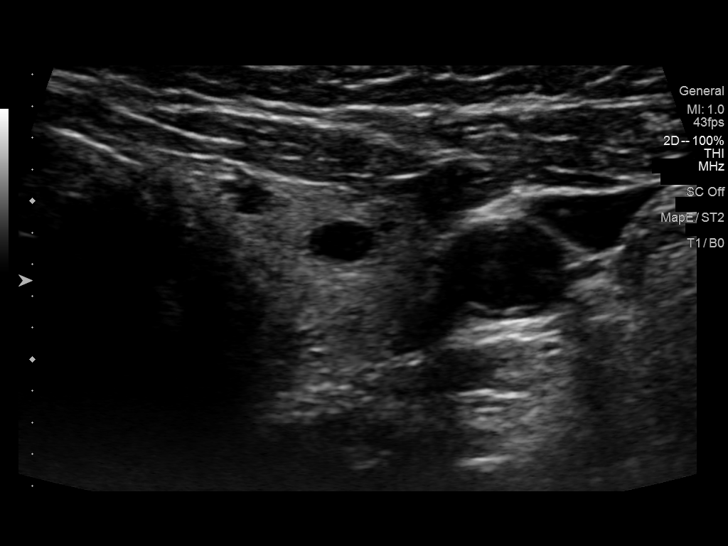
[im 20/39]
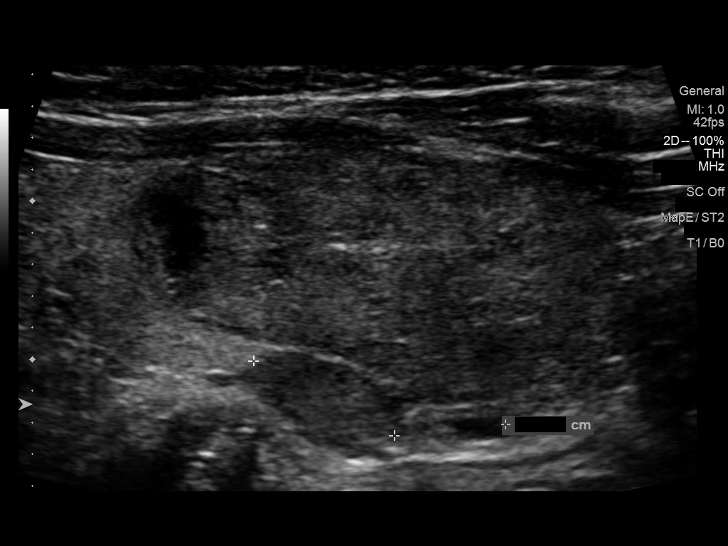
[im 23/39]
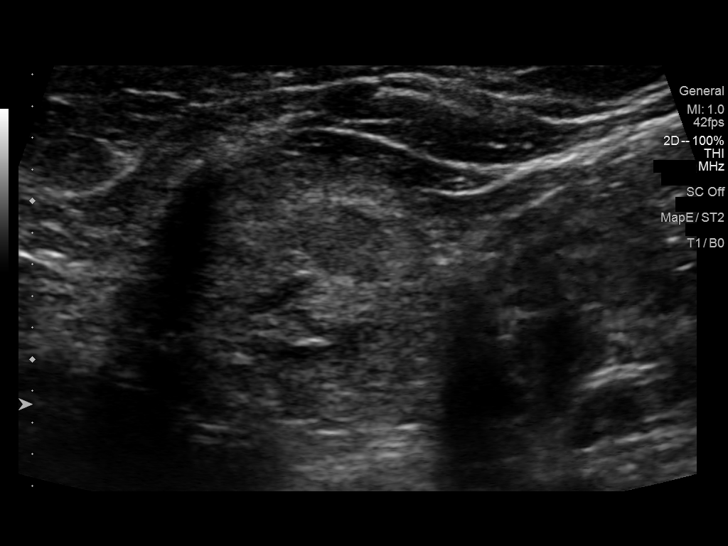
[im 26/39]
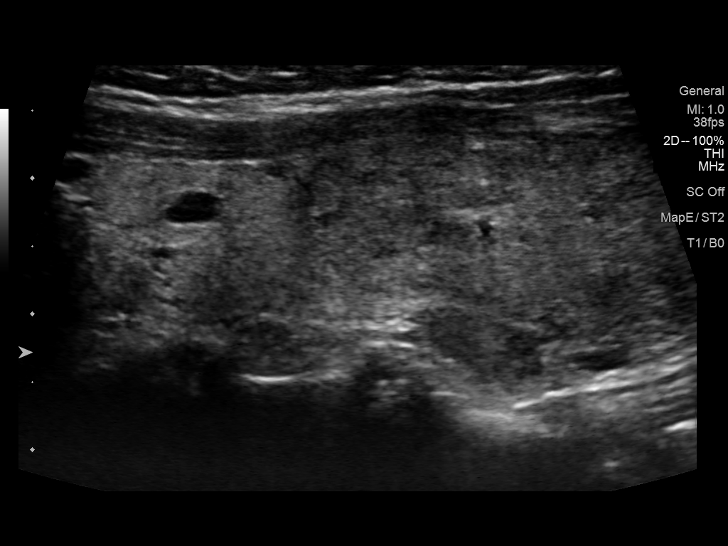
[im 29/39]
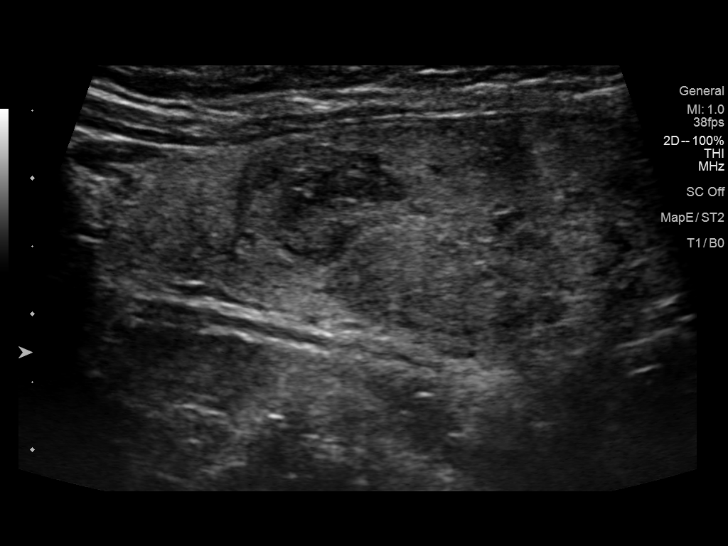
[im 32/39]
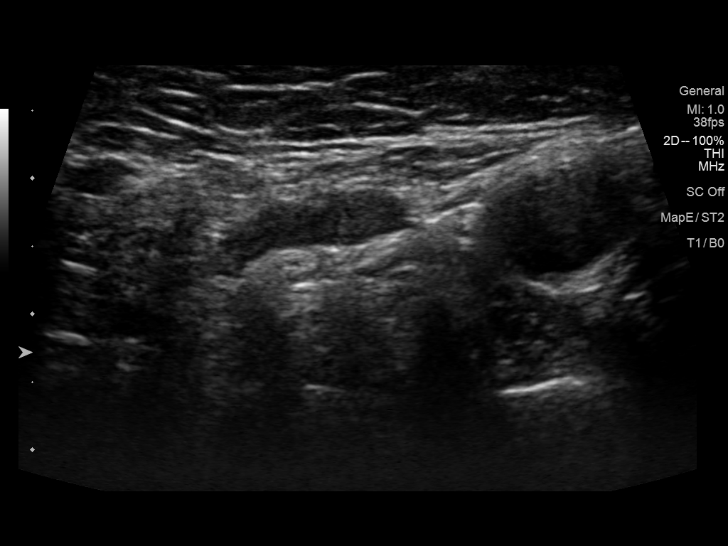
[im 35/39]
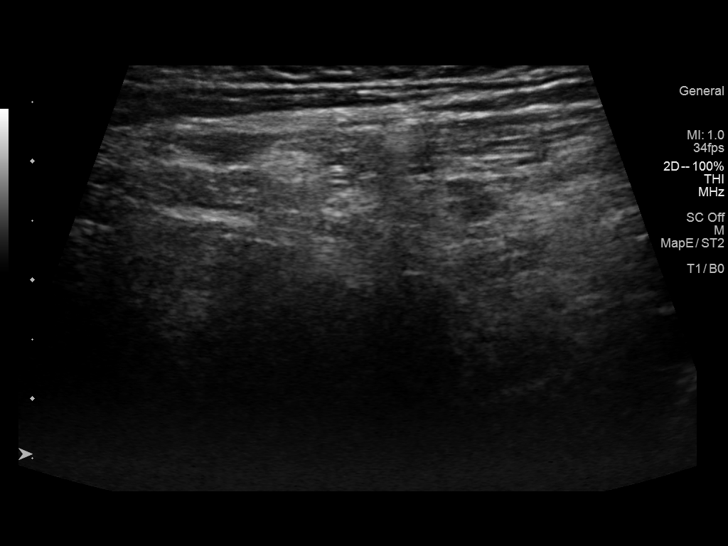
[im 39/39]
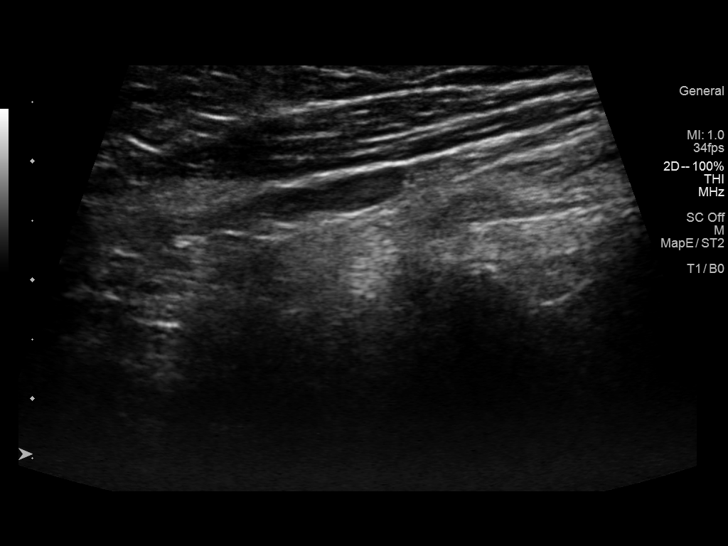

[13 of 25 positions shown; findings below may reference images not displayed]

FINDINGS: Parenchymal Echotexture: Moderately heterogenous

Isthmus: 0.4 cm

Right lobe: Surgically absent

Left lobe: 5.8 x 2.2 x 2.6 cm

_________________________________________________________

Estimated total number of nodules >/= 1 cm: 2

Number of spongiform nodules >/=  2 cm not described below (TR1): 0

Number of mixed cystic and solid nodules >/= 1.5 cm not described
below (TR2): 0

_________________________________________________________

Nodule # 1:

Location: Left; Inferior

Maximum size: 3.4 cm; Other 2 dimensions: 2.4 x 2 cm (previously
measured 3.3 x 2 x 1.7 cm).

Composition: solid/almost completely solid (2)

Echogenicity: isoechoic (1)

Shape: not taller-than-wide (0)

Margins: smooth (0)

Echogenic foci: none (0)

ACR TI-RADS total points: 3.

ACR TI-RADS risk category: TR3 (3 points).

ACR TI-RADS recommendations:

**Given size (>/= 2.5 cm) and appearance, fine needle aspiration of
this mildly suspicious nodule should be considered based on TI-RADS
criteria.

_________________________________________________________

Nodule # 2:

Location: Left; Inferior

Maximum size: 1 cm; Other 2 dimensions: 1 x 0.6 cm

Composition: solid/almost completely solid (2)

Echogenicity: hypoechoic (2)

Shape: not taller-than-wide (0)

Margins: smooth (0)

Echogenic foci: none (0)

ACR TI-RADS total points: 4.

ACR TI-RADS risk category: TR4 (4-6 points).

ACR TI-RADS recommendations:

*Given size (>/= 1 - 1.4 cm) and appearance, a follow-up ultrasound
in 1 year should be considered based on TI-RADS criteria.

_________________________________________________________

There is no lymphadenopathy.
IMPRESSION: 1. Stable 3.4 cm TR 3 thyroid nodule in the left inferior thyroid
gland. This was previously biopsied. Correlation with prior biopsy
results is recommended.
2. There is a 1 cm TR 4 thyroid nodule in the posterior left thyroid
gland. This nodule meets criteria for a 1 year follow-up ultrasound
study.
3. Status post right-sided hemithyroidectomy.
4. No pathologically enlarged lymph nodes.

The above is in keeping with the ACR TI-RADS recommendations - [HOSPITAL] 8017;[DATE].

## 2021-01-28 ENCOUNTER — Other Ambulatory Visit: Payer: Self-pay | Admitting: Surgery

## 2021-01-28 DIAGNOSIS — Z9009 Acquired absence of other part of head and neck: Secondary | ICD-10-CM

## 2021-01-28 DIAGNOSIS — M7989 Other specified soft tissue disorders: Secondary | ICD-10-CM

## 2021-01-28 DIAGNOSIS — E042 Nontoxic multinodular goiter: Secondary | ICD-10-CM

## 2021-02-04 ENCOUNTER — Ambulatory Visit
Admission: RE | Admit: 2021-02-04 | Discharge: 2021-02-04 | Disposition: A | Discharge status: Home / Self Care | Payer: 59 | Source: Ambulatory Visit | Attending: Surgery | Admitting: Surgery

## 2021-02-04 ENCOUNTER — Other Ambulatory Visit: Payer: Self-pay

## 2021-02-04 DIAGNOSIS — E042 Nontoxic multinodular goiter: Secondary | ICD-10-CM

## 2021-02-04 DIAGNOSIS — Z9009 Acquired absence of other part of head and neck: Secondary | ICD-10-CM

## 2021-02-04 DIAGNOSIS — M7989 Other specified soft tissue disorders: Secondary | ICD-10-CM

## 2021-02-07 NOTE — Progress Notes (Signed)
USN shows stable thyroid nodules without new findings.  However, the left neck mass located more posteriorly does not appear to be a lipoma.  Will order CT scan to further evaluate.  tmg  Armandina Gemma, MD [facility] [place] practice Office: [phone]

## 2021-02-15 ENCOUNTER — Other Ambulatory Visit: Payer: Self-pay | Admitting: Surgery

## 2021-02-15 DIAGNOSIS — M7989 Other specified soft tissue disorders: Secondary | ICD-10-CM

## 2021-03-09 ENCOUNTER — Ambulatory Visit
Admission: RE | Admit: 2021-03-09 | Discharge: 2021-03-09 | Disposition: A | Discharge status: Home / Self Care | Payer: 59 | Source: Ambulatory Visit | Attending: Surgery | Admitting: Surgery

## 2021-03-09 ENCOUNTER — Other Ambulatory Visit: Payer: Self-pay

## 2021-03-09 DIAGNOSIS — M7989 Other specified soft tissue disorders: Secondary | ICD-10-CM

## 2021-03-09 MED ORDER — IOPAMIDOL (ISOVUE-300) INJECTION 61%
75.0000 mL | Freq: Once | INTRAVENOUS | Status: AC | PRN
  Administered 2021-03-09: 75 mL via INTRAVENOUS

## 2021-03-14 ENCOUNTER — Other Ambulatory Visit: Payer: Self-pay | Admitting: General Surgery

## 2021-03-21 NOTE — Pre-Procedure Instructions (Signed)
Surgical Instructions ? ? ? Your procedure is scheduled on Thursday, March 16. ? Report to [facility] Main Entrance "A" at 1:30 P.M., then check in with the Admitting office. ? Call this number if you have problems the morning of surgery: ? [phone] ? ? If you have any questions prior to your surgery date call [phone]: Open Monday-Friday 8am-4pm ? ? ? Remember: ? Do not eat after midnight the night before your surgery ? ?You may drink clear liquids until 12:30PM the morning of your surgery.   ?Clear liquids allowed are: Water, Non-Citrus Juices (without pulp), Carbonated Beverages, Clear Tea, Black Coffee ONLY (NO MILK, CREAM OR POWDERED CREAMER of any kind), and Gatorade ?  ? Take these medicines the morning of surgery with A SIP OF WATER:  ? ?DULoxetine (CYMBALTA)  ?levothyroxine (SYNTHROID, LEVOTHROID) ?predniSONE (DELTASONE)  ?ALPRAZolam Duanne Moron) if needed ? ?As of today, STOP taking any Aspirin (unless otherwise instructed by your surgeon) Aleve, Naproxen, Ibuprofen, Motrin, Advil, Goody's, BC's, all herbal medications, fish oil, and all vitamins. ? ?         ?Do not wear jewelry or makeup ?Do not wear lotions, powders, perfumes/colognes, or deodorant. ?Do not shave 48 hours prior to surgery.  Men may shave face and neck. ?Do not bring valuables to the hospital. ?Do not wear nail polish, gel polish, artificial nails, or any other type of covering on natural nails (fingers and toes) ?If you have artificial nails or gel coating that need to be removed by a nail salon, please have this removed prior to surgery. Artificial nails or gel coating may interfere with anesthesia's ability to adequately monitor your vital signs. ? ?[facility] is not responsible for any belongings or valuables. .  ? ?Do NOT Smoke (Tobacco/Vaping)  24 hours prior to your procedure ? ?If you use a CPAP at night, you may bring your mask for your overnight stay. ?  ?Contacts, glasses, hearing aids, dentures or partials may not be worn  into surgery, please bring cases for these belongings ?  ?For patients admitted to the hospital, discharge time will be determined by your treatment team. ?  ?Patients discharged the day of surgery will not be allowed to drive home, and someone needs to stay with them for 24 hours. ? ?NO VISITORS WILL BE ALLOWED IN PRE-OP WHERE PATIENTS ARE PREPPED FOR SURGERY.  ONLY 1 SUPPORT PERSON MAY BE PRESENT IN THE WAITING ROOM WHILE YOU ARE IN SURGERY.  IF YOU ARE TO BE ADMITTED, ONCE YOU ARE IN YOUR ROOM YOU WILL BE ALLOWED TWO (2) VISITORS. 1 (ONE) VISITOR MAY STAY OVERNIGHT BUT MUST ARRIVE TO THE ROOM BY 8pm.  Minor children may have two parents present. Special consideration for safety and communication needs will be reviewed on a case by case basis. ? ?Special instructions:   ? ?Oral Hygiene is also important to reduce your risk of infection.  Remember - BRUSH YOUR TEETH THE MORNING OF SURGERY WITH YOUR REGULAR TOOTHPASTE ? ? ?[place] [facility]- Preparing For Surgery ? ?Before surgery, you can play an important role. Because skin is not sterile, your skin needs to be as free of germs as possible. You can reduce the number of germs on your skin by washing with CHG (chlorahexidine gluconate) Soap before surgery.  CHG is an antiseptic cleaner which kills germs and bonds with the skin to continue killing germs even after washing.   ? ? ?Please do not use if you have an allergy to CHG or antibacterial  soaps. If your skin becomes reddened/irritated stop using the CHG.  ?Do not shave (including legs and underarms) for at least 48 hours prior to first CHG shower. It is OK to shave your face. ? ?Please follow these instructions carefully. ?  ? ? Shower the NIGHT BEFORE SURGERY and the MORNING OF SURGERY with CHG Soap.  ? If you chose to wash your hair, wash your hair first as usual with your normal shampoo. After you shampoo, rinse your hair and body thoroughly to remove the shampoo.  Then [employer] and genitals (private parts) with  your normal soap and rinse thoroughly to remove soap. ? ?After that Use CHG Soap as you would any other liquid soap. You can apply CHG directly to the skin and wash gently with a scrungie or a clean washcloth.  ? ?Apply the CHG Soap to your body ONLY FROM THE NECK DOWN.  Do not use on open wounds or open sores. Avoid contact with your eyes, ears, mouth and genitals (private parts). Wash Face and genitals (private parts)  with your normal soap.  ? ?Wash thoroughly, paying special attention to the area where your surgery will be performed. ? ?Thoroughly rinse your body with warm water from the neck down. ? ?DO NOT shower/wash with your normal soap after using and rinsing off the CHG Soap. ? ?Pat yourself dry with a CLEAN TOWEL. ? ?Wear CLEAN PAJAMAS to bed the night before surgery ? ?Place CLEAN SHEETS on your bed the night before your surgery ? ?DO NOT SLEEP WITH PETS. ? ? ?Day of Surgery: ? ?Take a shower with CHG soap. ?Wear Clean/Comfortable clothing the morning of surgery ?Do not apply any deodorants/lotions.   ?Remember to brush your teeth WITH YOUR REGULAR TOOTHPASTE. ? ? ? ?COVID testing ? ?If you are going to stay overnight or be admitted after your procedure/surgery and require a pre-op COVID test, please follow these instructions after your COVID test  ? ?You are not required to quarantine however you are required to wear a well-fitting mask when you are out and around people not in your household.  If your mask becomes wet or soiled, replace with a new one. ? ?Wash your hands often with soap and water for 20 seconds or clean your hands with an alcohol-based hand sanitizer that contains at least 60% alcohol. ? ?Do not share personal items. ? ?Notify your provider: ?if you are in close contact with someone who has COVID  ?or if you develop a fever of 100.4 or greater, sneezing, cough, sore throat, shortness of breath or body aches. ? ?  ?Please read over the following fact sheets that you were given.  ? ?

## 2021-03-22 ENCOUNTER — Encounter (HOSPITAL_COMMUNITY)
Admission: RE | Admit: 2021-03-22 | Discharge: 2021-03-22 | Disposition: A | Discharge status: Home / Self Care | Payer: 59 | Source: Ambulatory Visit | Attending: General Surgery | Admitting: General Surgery

## 2021-03-22 ENCOUNTER — Other Ambulatory Visit: Payer: Self-pay

## 2021-03-22 ENCOUNTER — Encounter (HOSPITAL_COMMUNITY): Payer: Self-pay

## 2021-03-22 VITALS — BP 135/68 | HR 64 | Temp 97.8°F | Resp 18 | Ht 66.0 in | Wt 151.4 lb

## 2021-03-22 DIAGNOSIS — Z01818 Encounter for other preprocedural examination: Secondary | ICD-10-CM

## 2021-03-22 DIAGNOSIS — Z01812 Encounter for preprocedural laboratory examination: Secondary | ICD-10-CM | POA: Insufficient documentation

## 2021-03-22 HISTORY — DX: Depression, unspecified: F32.A

## 2021-03-22 HISTORY — DX: Anxiety disorder, unspecified: F41.9

## 2021-03-22 LAB — CBC
HCT: 45.9 % (ref 36.0–46.0)
Hemoglobin: 14.9 g/dL (ref 12.0–15.0)
MCH: 30.7 pg (ref 26.0–34.0)
MCHC: 32.5 g/dL (ref 30.0–36.0)
MCV: 94.6 fL (ref 80.0–100.0)
Platelets: 346 10*3/uL (ref 150–400)
RBC: 4.85 MIL/uL (ref 3.87–5.11)
RDW: 13 % (ref 11.5–15.5)
WBC: 6.5 10*3/uL (ref 4.0–10.5)
nRBC: 0 % (ref 0.0–0.2)

## 2021-03-22 NOTE — Progress Notes (Signed)
PCP - Richard tisovec ?Dr. Harlow Asa CCS ? ?PPM/ICD - denies ? ? ?Chest x-ray - n/a ?EKG - n/a ?Stress Test -denies  ?ECHO - denies ?Cardiac Cath - denies ? ?Sleep Study - denies ? ? ?Patient instructed to hold all Aspirin, NSAID's, herbal medications, fish oil and vitamins 7 days prior to surgery. ? ? ?ERAS Protcol -yes, clear liquids until 1230pm ?PRE-SURGERY Ensure or G2-  ? ?COVID TEST- ambulatory surgery ? ? ?Anesthesia review: no ? ?Patient denies shortness of breath, fever, cough and chest pain at PAT appointment ? ? ?All instructions explained to the patient, with a verbal understanding of the material. Patient agrees to go over the instructions while at home for a better understanding. Patient also instructed to self quarantine after being tested for COVID-19. The opportunity to ask questions was provided. ? ? ?

## 2021-03-23 NOTE — H&P (Signed)
?PROVIDER: Georgianne Fick, MD ? ?MRN: [id number] ?DOB: 05-03-1955 ?DATE OF ENCOUNTER: 03/14/2021 ?Subjective  ? ?Chief Complaint: Soft Tissue Mass ? ? ?History of Present Illness: ?Christine Collier is a 66 y.o. female who is seen today for left posterior neck mass. ? ?Pt was referred by Dr. Harlow Asa for a posterior neck mass. He has been seeing her for her h/o thyroid cancer. She thought she had a lipoma, but Dr. Harlow Asa felt it was much firmer and deeper than a lipoma should be. A CT was performed that was concerning for a possible sarcoma. She is referred to me for management. It is not painful. She has not noted a dramatic change. It has been there for at least a year. It is not painful.  ? ?CT neck with contrast 03/09/21 ?IMPRESSION: ?1. The palpable complaint in the left posterior neck is a 3.7 cm ?solid mass. Distance from primary and solitary nature suggests this ?is a separate process, possibly sarcoma. The mass is avidly ?enhancing with recruitment of multiple vessels. ?2. 6 mm nodule in the right upper lobe, recommend full chest CT but ?consider delay until pathology is available from #1. ? ?Review of Systems: ?A complete review of systems was obtained from the patient. I have reviewed this information and discussed as appropriate with the patient. See HPI as well for other ROS. ? ?Review of Systems  ?All other systems reviewed and are negative. ? ? ?Medical History: ?Past Medical History:  ?Diagnosis Date  ? Anxiety  ? History of cancer  ? Thyroid disease  ? ?Patient Active Problem List  ?Diagnosis  ? Multiple thyroid nodules  ? History of lobectomy of thyroid  ? Soft tissue mass  ? Anxiety disorder  ? Bilateral leg numbness  ? Encounter for general adult medical examination without abnormal findings  ? History of malignant neoplasm of thyroid  ? Hypothyroidism  ? Lipoma of neck  ? Moderate recurrent major depression (CMS-HCC)  ? Pure hypercholesterolemia  ? Seasonal allergic rhinitis  ? Non-toxic  multinodular goiter  ? ?Past Surgical History:  ?Procedure Laterality Date  ? Partial Thyroidectomy 2001  ? ? ?Allergies  ?Allergen Reactions  ? Bacitracin Zinc-Polymyxin B Swelling  ? Sulfa (Sulfonamide Antibiotics) Unknown  ? ?Current Outpatient Medications on File Prior to Visit  ?Medication Sig Dispense Refill  ? DULoxetine (CYMBALTA) 30 MG DR capsule Take 1 capsule po qd  ? levothyroxine (SYNTHROID) 75 MCG tablet  ? ?No current facility-administered medications on file prior to visit.  ? ?Family History  ?Problem Relation Age of Onset  ? High blood pressure (Hypertension) Mother  ? Colon cancer Mother  ? High blood pressure (Hypertension) Father  ? ? ?Social History  ? ?Tobacco Use  ?Smoking Status Never  ?Smokeless Tobacco Never  ? ? ?Social History  ? ?Socioeconomic History  ? Marital status: Married  ?Tobacco Use  ? Smoking status: Never  ? Smokeless tobacco: Never  ?Vaping Use  ? Vaping Use: Unknown  ?Substance and Sexual Activity  ? Alcohol use: Yes  ?Comment: socially  ? Drug use: Never  ? ?Objective:  ? ?There were no vitals filed for this visit.  ?There is no height or weight on file to calculate BMI. ? ?Head: Normocephalic and atraumatic.  ?Eyes: Conjunctivae are normal. Pupils are equal, round, and reactive to light. No scleral icterus.  ?Neck: Normal range of motion. Neck supple. No tracheal deviation present. No thyromegaly present. Left posterior neck has a 3-4 cm mobile mass that is a  bit rubbery. It is non tender with no overlying skin changes.  ?Resp: No respiratory distress, normal effort. ?Neurological: Alert and oriented to person, place, and time. Coordination normal.  ?Skin: Skin is warm and dry. No rash noted. No diaphoretic. No erythema. No pallor.  ?Psychiatric: Normal mood and affect. Normal behavior. Judgment and thought content normal.  ? ?Assessment and Plan:  ? ?Diagnoses and all orders for this visit: ? ?Soft tissue mass ? ?Patient has a left posterior neck soft tissue mass. This  does appear concerning on her scan for a possible sarcoma. It is not super firm, but definitely feels a little firmer than a traditional lipoma would feel. At the do think it needs excision. ? ?I discussed excision with the patient and her husband. I reviewed that I would make a oblique incision in the skin lines and dermatomal lines. I discussed the risks of surgery including bleeding, infection, wound breakdown, numbness, possible nerve damage and subsequent muscle weakness of the neck. I discussed risk of heart or lung problems. I reviewed the risk of significant neck muscle tightness and headaches. I discussed that some patients need to go to physical therapy and take muscle relaxants for this for a prolonged period of time. ? ?We will do this as soon as possible at a mutually available date. ? 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 I think she will need at least 2 weeks off. This may need to be adjusted if she has any complications. ? ?I also discussed that soft tissue masses traditionally have a long pathology turnaround time. I discussed that sometimes it is up to 2 weeks before we get the path back as they ended off for review at another institution. I discussed that if this is a sarcoma, there is a significant possibility of receiving radiation. ? ?

## 2021-03-24 ENCOUNTER — Encounter (HOSPITAL_COMMUNITY)
Admission: RE | Disposition: A | Discharge status: Home / Self Care | Payer: Self-pay | Source: Home / Self Care | Attending: General Surgery

## 2021-03-24 ENCOUNTER — Ambulatory Visit (HOSPITAL_COMMUNITY): Payer: 59 | Admitting: Anesthesiology

## 2021-03-24 ENCOUNTER — Encounter (HOSPITAL_COMMUNITY): Payer: Self-pay | Admitting: General Surgery

## 2021-03-24 ENCOUNTER — Ambulatory Visit (HOSPITAL_COMMUNITY)
Admission: RE | Admit: 2021-03-24 | Discharge: 2021-03-24 | Disposition: A | Discharge status: Home / Self Care | Payer: 59 | Attending: General Surgery | Admitting: General Surgery

## 2021-03-24 ENCOUNTER — Ambulatory Visit (HOSPITAL_BASED_OUTPATIENT_CLINIC_OR_DEPARTMENT_OTHER): Payer: 59 | Admitting: Anesthesiology

## 2021-03-24 DIAGNOSIS — F419 Anxiety disorder, unspecified: Secondary | ICD-10-CM | POA: Insufficient documentation

## 2021-03-24 DIAGNOSIS — F32A Depression, unspecified: Secondary | ICD-10-CM | POA: Insufficient documentation

## 2021-03-24 DIAGNOSIS — E039 Hypothyroidism, unspecified: Secondary | ICD-10-CM | POA: Insufficient documentation

## 2021-03-24 DIAGNOSIS — Z85850 Personal history of malignant neoplasm of thyroid: Secondary | ICD-10-CM | POA: Insufficient documentation

## 2021-03-24 DIAGNOSIS — R221 Localized swelling, mass and lump, neck: Secondary | ICD-10-CM | POA: Diagnosis not present

## 2021-03-24 DIAGNOSIS — D210 Benign neoplasm of connective and other soft tissue of head, face and neck: Secondary | ICD-10-CM | POA: Insufficient documentation

## 2021-03-24 HISTORY — PX: MASS EXCISION: SHX2000

## 2021-03-24 SURGERY — EXCISION MASS
Anesthesia: General | Laterality: Left

## 2021-03-24 MED ORDER — BUPIVACAINE-EPINEPHRINE (PF) 0.25% -1:200000 IJ SOLN
INTRAMUSCULAR | Status: AC
  Filled 2021-03-24: qty 30

## 2021-03-24 MED ORDER — LACTATED RINGERS IV SOLN: INTRAVENOUS | Status: DC

## 2021-03-24 MED ORDER — SUGAMMADEX SODIUM 200 MG/2ML IV SOLN
INTRAVENOUS | Status: DC | PRN
  Administered 2021-03-24: 100 mg via INTRAVENOUS
  Administered 2021-03-24: 200 mg via INTRAVENOUS

## 2021-03-24 MED ORDER — ONDANSETRON HCL 4 MG/2ML IJ SOLN
INTRAMUSCULAR | Status: AC
  Filled 2021-03-24: qty 2

## 2021-03-24 MED ORDER — ACETAMINOPHEN 500 MG PO TABS
1000.0000 mg | ORAL_TABLET | ORAL | Status: AC
  Administered 2021-03-24: 1000 mg via ORAL
  Filled 2021-03-24: qty 2

## 2021-03-24 MED ORDER — OXYCODONE HCL 5 MG PO TABS
5.0000 mg | ORAL_TABLET | Freq: Once | ORAL | Status: AC | PRN
  Administered 2021-03-24: 5 mg via ORAL

## 2021-03-24 MED ORDER — OXYCODONE HCL 5 MG/5ML PO SOLN: 5.0000 mg | Freq: Once | ORAL | Status: AC | PRN

## 2021-03-24 MED ORDER — PHENYLEPHRINE 40 MCG/ML (10ML) SYRINGE FOR IV PUSH (FOR BLOOD PRESSURE SUPPORT)
PREFILLED_SYRINGE | INTRAVENOUS | Status: AC
  Filled 2021-03-24: qty 20

## 2021-03-24 MED ORDER — AMISULPRIDE (ANTIEMETIC) 5 MG/2ML IV SOLN
INTRAVENOUS | Status: AC
  Filled 2021-03-24: qty 4

## 2021-03-24 MED ORDER — LIDOCAINE 2% (20 MG/ML) 5 ML SYRINGE
INTRAMUSCULAR | Status: DC | PRN
  Administered 2021-03-24: 80 mg via INTRAVENOUS

## 2021-03-24 MED ORDER — MIDAZOLAM HCL 2 MG/2ML IJ SOLN
INTRAMUSCULAR | Status: AC
  Filled 2021-03-24: qty 2

## 2021-03-24 MED ORDER — CHLORHEXIDINE GLUCONATE CLOTH 2 % EX PADS: 6.0000 | MEDICATED_PAD | Freq: Once | CUTANEOUS | Status: DC

## 2021-03-24 MED ORDER — LIDOCAINE 2% (20 MG/ML) 5 ML SYRINGE
INTRAMUSCULAR | Status: AC
  Filled 2021-03-24: qty 5

## 2021-03-24 MED ORDER — FENTANYL CITRATE (PF) 250 MCG/5ML IJ SOLN
INTRAMUSCULAR | Status: AC
  Filled 2021-03-24: qty 5

## 2021-03-24 MED ORDER — DEXAMETHASONE SODIUM PHOSPHATE 10 MG/ML IJ SOLN
INTRAMUSCULAR | Status: AC
  Filled 2021-03-24: qty 1

## 2021-03-24 MED ORDER — ONDANSETRON HCL 4 MG/2ML IJ SOLN
INTRAMUSCULAR | Status: DC | PRN
Start: 2021-03-24 — End: 2021-03-24
  Administered 2021-03-24: 4 mg via INTRAVENOUS

## 2021-03-24 MED ORDER — LIDOCAINE HCL (PF) 1 % IJ SOLN
INTRAMUSCULAR | Status: AC
  Filled 2021-03-24: qty 30

## 2021-03-24 MED ORDER — DEXAMETHASONE SODIUM PHOSPHATE 10 MG/ML IJ SOLN
INTRAMUSCULAR | Status: DC | PRN
  Administered 2021-03-24: 5 mg via INTRAVENOUS

## 2021-03-24 MED ORDER — ROCURONIUM BROMIDE 10 MG/ML (PF) SYRINGE
PREFILLED_SYRINGE | INTRAVENOUS | Status: AC
  Filled 2021-03-24: qty 10

## 2021-03-24 MED ORDER — PHENYLEPHRINE 40 MCG/ML (10ML) SYRINGE FOR IV PUSH (FOR BLOOD PRESSURE SUPPORT)
PREFILLED_SYRINGE | INTRAVENOUS | Status: DC | PRN
  Administered 2021-03-24: 40 ug via INTRAVENOUS
  Administered 2021-03-24: 80 ug via INTRAVENOUS

## 2021-03-24 MED ORDER — OXYCODONE HCL 5 MG PO TABS: 5.0000 mg | ORAL_TABLET | Freq: Four times a day (QID) | ORAL | 0 refills | Status: AC | PRN

## 2021-03-24 MED ORDER — GABAPENTIN 100 MG PO CAPS
100.0000 mg | ORAL_CAPSULE | ORAL | Status: AC
  Administered 2021-03-24: 100 mg via ORAL
  Filled 2021-03-24: qty 1

## 2021-03-24 MED ORDER — OXYCODONE HCL 5 MG PO TABS
ORAL_TABLET | ORAL | Status: AC
  Filled 2021-03-24: qty 1

## 2021-03-24 MED ORDER — ROCURONIUM BROMIDE 10 MG/ML (PF) SYRINGE
PREFILLED_SYRINGE | INTRAVENOUS | Status: DC | PRN
  Administered 2021-03-24: 60 mg via INTRAVENOUS

## 2021-03-24 MED ORDER — AMISULPRIDE (ANTIEMETIC) 5 MG/2ML IV SOLN
10.0000 mg | Freq: Once | INTRAVENOUS | Status: AC | PRN
  Administered 2021-03-24: 10 mg via INTRAVENOUS

## 2021-03-24 MED ORDER — MIDAZOLAM HCL 5 MG/5ML IJ SOLN
INTRAMUSCULAR | Status: DC | PRN
  Administered 2021-03-24: 2 mg via INTRAVENOUS

## 2021-03-24 MED ORDER — ONDANSETRON HCL 4 MG/2ML IJ SOLN: 4.0000 mg | Freq: Once | INTRAMUSCULAR | Status: DC | PRN

## 2021-03-24 MED ORDER — BACLOFEN 10 MG PO TABS
10.0000 mg | ORAL_TABLET | Freq: Three times a day (TID) | ORAL | 1 refills | Status: AC | PRN
Start: 2021-03-24 — End: 2022-03-24

## 2021-03-24 MED ORDER — PROPOFOL 10 MG/ML IV BOLUS
INTRAVENOUS | Status: DC | PRN
  Administered 2021-03-24: 150 mg via INTRAVENOUS

## 2021-03-24 MED ORDER — FENTANYL CITRATE (PF) 100 MCG/2ML IJ SOLN: 25.0000 ug | INTRAMUSCULAR | Status: DC | PRN

## 2021-03-24 MED ORDER — FENTANYL CITRATE (PF) 250 MCG/5ML IJ SOLN
INTRAMUSCULAR | Status: DC | PRN
  Administered 2021-03-24 (×3): 50 ug via INTRAVENOUS

## 2021-03-24 MED ORDER — ORAL CARE MOUTH RINSE: 15.0000 mL | Freq: Once | OROMUCOSAL | Status: AC

## 2021-03-24 MED ORDER — CEFAZOLIN SODIUM-DEXTROSE 2-4 GM/100ML-% IV SOLN
2.0000 g | INTRAVENOUS | Status: AC
  Administered 2021-03-24: 2 g via INTRAVENOUS
  Filled 2021-03-24: qty 100

## 2021-03-24 MED ORDER — BUPIVACAINE-EPINEPHRINE (PF) 0.25% -1:200000 IJ SOLN
INTRAMUSCULAR | Status: DC | PRN
  Administered 2021-03-24: 10 mL via INTRADERMAL

## 2021-03-24 MED ORDER — CHLORHEXIDINE GLUCONATE 0.12 % MT SOLN
15.0000 mL | Freq: Once | OROMUCOSAL | Status: AC
  Administered 2021-03-24: 15 mL via OROMUCOSAL
  Filled 2021-03-24: qty 15

## 2021-03-24 SURGICAL SUPPLY — 33 items
BAG COUNTER SPONGE SURGICOUNT (BAG) ×2 IMPLANT
BENZOIN TINCTURE PRP APPL 2/3 (GAUZE/BANDAGES/DRESSINGS) ×2 IMPLANT
CANISTER SUCT 3000ML PPV (MISCELLANEOUS) ×2 IMPLANT
CHLORAPREP W/TINT 26 (MISCELLANEOUS) ×2 IMPLANT
CLIP TI MEDIUM 6 (CLIP) ×1 IMPLANT
COVER SURGICAL LIGHT HANDLE (MISCELLANEOUS) ×2 IMPLANT
DERMABOND ADVANCED (GAUZE/BANDAGES/DRESSINGS) ×1
DERMABOND ADVANCED .7 DNX12 (GAUZE/BANDAGES/DRESSINGS) IMPLANT
DRAPE LAPAROTOMY 100X72 PEDS (DRAPES) ×1 IMPLANT
ELECT REM PT RETURN 9FT ADLT (ELECTROSURGICAL) ×2
ELECTRODE REM PT RTRN 9FT ADLT (ELECTROSURGICAL) ×1 IMPLANT
GLOVE SURG ENC MOIS LTX SZ6 (GLOVE) ×2 IMPLANT
GLOVE SURG UNDER LTX SZ6.5 (GLOVE) ×2 IMPLANT
GOWN STRL REUS W/ TWL LRG LVL3 (GOWN DISPOSABLE) ×1 IMPLANT
GOWN STRL REUS W/TWL 2XL LVL3 (GOWN DISPOSABLE) ×2 IMPLANT
GOWN STRL REUS W/TWL LRG LVL3 (GOWN DISPOSABLE) ×2
KIT BASIN OR (CUSTOM PROCEDURE TRAY) ×2 IMPLANT
KIT TURNOVER KIT B (KITS) ×2 IMPLANT
NDL HYPO 25GX1X1/2 BEV (NEEDLE) ×1 IMPLANT
NEEDLE HYPO 25GX1X1/2 BEV (NEEDLE) ×2 IMPLANT
NS IRRIG 1000ML POUR BTL (IV SOLUTION) ×2 IMPLANT
PACK GENERAL/GYN (CUSTOM PROCEDURE TRAY) ×2 IMPLANT
PAD ARMBOARD 7.5X6 YLW CONV (MISCELLANEOUS) ×4 IMPLANT
PENCIL SMOKE EVACUATOR (MISCELLANEOUS) ×2 IMPLANT
SPECIMEN JAR SMALL (MISCELLANEOUS) ×2 IMPLANT
SUT MON AB 4-0 PC3 18 (SUTURE) ×2 IMPLANT
SUT SILK 2 0 PERMA HAND 18 BK (SUTURE) ×1 IMPLANT
SUT VIC AB 3-0 SH 27 (SUTURE) ×2
SUT VIC AB 3-0 SH 27X BRD (SUTURE) ×1 IMPLANT
SUT VIC AB 3-0 SH 8-18 (SUTURE) ×1 IMPLANT
SYR CONTROL 10ML LL (SYRINGE) ×2 IMPLANT
TOWEL GREEN STERILE (TOWEL DISPOSABLE) ×2 IMPLANT
TOWEL GREEN STERILE FF (TOWEL DISPOSABLE) ×2 IMPLANT

## 2021-03-24 NOTE — Discharge Instructions (Addendum)
[facility] ?Office Phone Number [phone] ? ? POST OP INSTRUCTIONS ? ?Always review your discharge instruction sheet given to you by the facility where your surgery was performed. ? ?IF YOU HAVE DISABILITY OR FAMILY LEAVE FORMS, YOU MUST BRING THEM TO THE OFFICE FOR PROCESSING.  DO NOT GIVE THEM TO YOUR DOCTOR. ? ?Take 2 tylenol (acetominophen) three times a day for 3 days.  If you still have pain, add ibuprofen with food in between if able to take this (if you have kidney issues or stomach issues, do not take ibuprofen).  If both of those are not enough, add the narcotic pain pill.  If you find you are needing a lot of this overnight after surgery, call the next morning for a refill.   ?Take your usually prescribed medications unless otherwise directed ?If you need a refill on your pain medication, please contact your pharmacy.  They will contact our office to request authorization.  Prescriptions will not be filled after 5pm or on week-ends. ?You should eat very light the first 24 hours after surgery, such as soup, crackers, pudding, etc.  Resume your normal diet the day after surgery ?It is common to experience some constipation if taking pain medication after surgery.  Increasing fluid intake and taking a stool softener will usually help or prevent this problem from occurring.  A mild laxative (Milk of Magnesia or Miralax) should be taken according to package directions if there are no bowel movements after 48 hours. ?You may shower in 48 hours.  The surgical glue will flake off in 2-3 weeks.   ?ACTIVITIES:  No strenuous activity or heavy lifting for 1 week.   ?You may drive when you no longer are taking prescription pain medication, you can comfortably wear a seatbelt, and you can safely maneuver your car and apply brakes. ?RETURN TO WORK:  __________1 week _______________ ?You should see your doctor in the office for a follow-up appointment approximately three-four weeks after your surgery.    ? ?WHEN TO CALL YOUR DOCTOR: ?Fever over 101.0 ?Nausea and/or vomiting. ?Extreme swelling or bruising. ?Continued bleeding from incision. ?Increased pain, redness, or drainage from the incision. ? ?The clinic staff is available to answer your questions during regular business hours.  Please don?t hesitate to call and ask to speak to one of the nurses for clinical concerns.  If you have a medical emergency, go to the nearest emergency room or call 911.  A surgeon from [facility] is always on call at the hospital. ? ?For further questions, please visit [email or web address]  ? ?

## 2021-03-24 NOTE — Interval H&P Note (Signed)
History and Physical Interval Note: ? ?03/24/2021 ?1:34 PM ? ?Christine Collier  has presented today for surgery, with the diagnosis of LEFT POSTERIOR NECK MASS.  The various methods of treatment have been discussed with the patient and family. After consideration of risks, benefits and other options for treatment, the patient has consented to  Procedure(s): ?EXCISION OF LEFT POSTERIOR NECK MASS (Left) as a surgical intervention.  The patient's history has been reviewed, patient examined, no change in status, stable for surgery.  I have reviewed the patient's chart and labs.  Questions were answered to the patient's satisfaction.   ? ? ?Stark Klein ? ? ?

## 2021-03-24 NOTE — Transfer of Care (Signed)
Immediate Anesthesia Transfer of Care Note ? ?Patient: Christine Collier ? ?Procedure(s) Performed: EXCISION OF LEFT POSTERIOR NECK MASS (Left) ? ?Patient Location: PACU ? ?Anesthesia Type:General ? ?Level of Consciousness: awake and patient cooperative ? ?Airway & Oxygen Therapy: Patient Spontanous Breathing and Patient connected to face mask oxygen ? ?Post-op Assessment: Report given to RN and Post -op Vital signs reviewed and stable ? ?Post vital signs: Reviewed and stable ? ?Last Vitals:  ?Vitals Value Taken Time  ?BP 137/102 03/24/21 1531  ?Temp    ?Pulse 97 03/24/21 1532  ?Resp 17 03/24/21 1532  ?SpO2 98 % 03/24/21 1532  ?Vitals shown include unvalidated device data. ? ?Last Pain:  ?Vitals:  ? 03/24/21 1306  ?TempSrc: Oral  ?   ? ?  ? ?Complications: No notable events documented. ?

## 2021-03-24 NOTE — Anesthesia Preprocedure Evaluation (Addendum)
Anesthesia Evaluation  ?Patient identified by MRN, date of birth, ID band ?Patient awake ? ? ? ?Reviewed: ?Allergy & Precautions, NPO status , Patient's Chart, lab work & pertinent test results ? ?Airway ?Mallampati: II ? ?TM Distance: <3 FB ?Neck ROM: Full ? ? ?Comment: Narrow palate ?Torus palatinus Dental ?no notable dental hx. ? ?  ?Pulmonary ?neg pulmonary ROS,  ?  ?Pulmonary exam normal ?breath sounds clear to auscultation ? ? ? ? ? ? Cardiovascular ?negative cardio ROS ?Normal cardiovascular exam ?Rhythm:Regular Rate:Normal ? ? ?  ?Neuro/Psych ?PSYCHIATRIC DISORDERS Anxiety Depression Bilateral leg numbness ?  ? GI/Hepatic ?negative GI ROS, Neg liver ROS,   ?Endo/Other  ?Hypothyroidism  ? Renal/GU ?negative Renal ROS  ?negative genitourinary ?  ?Musculoskeletal ?negative musculoskeletal ROS ?(+)  ? Abdominal ?  ?Peds ?negative pediatric ROS ?(+)  Hematology ?negative hematology ROS ?(+)   ?Anesthesia Other Findings ? ? Reproductive/Obstetrics ?negative OB ROS ? ?  ? ? ? ? ? ? ? ? ? ? ? ? ? ?  ?  ? ? ? ? ? ?Anesthesia Physical ?Anesthesia Plan ? ?ASA: 2 ? ?Anesthesia Plan: General  ? ?Post-op Pain Management: Tylenol PO (pre-op)* and Gabapentin PO (pre-op)*  ? ?Induction: Intravenous ? ?PONV Risk Score and Plan: 3 and Treatment may vary due to age or medical condition, Ondansetron, Dexamethasone and Midazolam ? ?Airway Management Planned: Oral ETT ? ?Additional Equipment:  ? ?Intra-op Plan:  ? ?Post-operative Plan: Extubation in OR ? ?Informed Consent: I have reviewed the patients History and Physical, chart, labs and discussed the procedure including the risks, benefits and alternatives for the proposed anesthesia with the patient or authorized representative who has indicated his/her understanding and acceptance.  ? ? ? ?Dental advisory given ? ?Plan Discussed with: CRNA, Anesthesiologist and Surgeon ? ?Anesthesia Plan Comments:   ? ? ? ? ?Anesthesia Quick Evaluation ? ?

## 2021-03-24 NOTE — Anesthesia Procedure Notes (Signed)
Procedure Name: Intubation ?Date/Time: 03/24/2021 2:13 PM ?Performed by: Kyung Rudd, CRNA ?Pre-anesthesia Checklist: Patient identified, Emergency Drugs available, Suction available and Patient being monitored ?Patient Re-evaluated:Patient Re-evaluated prior to induction ?Oxygen Delivery Method: Circle System Utilized ?Preoxygenation: Pre-oxygenation with 100% oxygen ?Induction Type: IV induction ?Ventilation: Mask ventilation without difficulty ?Laryngoscope Size: Mac and 3 ?Grade View: Grade II ?Tube type: Oral ?Tube size: 7.0 mm ?Number of attempts: 1 ?Airway Equipment and Method: Stylet and Oral airway ?Placement Confirmation: ETT inserted through vocal cords under direct vision, positive ETCO2 and breath sounds checked- equal and bilateral ?Secured at: 21 cm ?Tube secured with: Tape ?Dental Injury: Teeth and Oropharynx as per pre-operative assessment  ? ? ? ? ?

## 2021-03-24 NOTE — Op Note (Signed)
PRE-OPERATIVE DIAGNOSIS: left posterior neck mass ? ?POST-OPERATIVE DIAGNOSIS:  Same ? ?PROCEDURE:  Procedure(s): ?Radical excision of left posterior neck mass ? ?SURGEON:  Surgeon(s): ?Stark Klein, MD ? ?Assist: ?Pryor Curia, RNFA ? ?ANESTHESIA:   local and general ? ?DRAINS: none  ? ?LOCAL MEDICATIONS USED:  BUPIVICAINE  and LIDOCAINE  ? ?SPECIMEN:  Source of Specimen:  left posterior neck mass ? ?DISPOSITION OF SPECIMEN:  PATHOLOGY ? ?COUNTS:  YES ? ?DICTATION: .Dragon Dictation ? ?PLAN OF CARE: Discharge to home after PACU ? ?PATIENT DISPOSITION:  PACU - hemodynamically stable. ? ?FINDINGS:  3.5 x 4 cm mass partially subfascial ? ?EBL: min ? ?PROCEDURE:  ? ?Patient was identified in the holding area and to the operating room where she was intubated on the stretcher.  Patient was then turned into the prone position taking care to do appropriate padding.  The left posterior neck was then clipped, prepped and draped in sterile fashion.  A timeout was performed according to the surgical safety checklist.  When all was correct, we continued.   ? ?An obliquely oriented ellipse of skin was drawn over the mass.  Incision was made along these lines and the subcutaneous tissue was divided with the cautery.  Care was taken to take a margin of fatty tissue around the mass.  Once we got down to the muscle, the fascia and superficial muscle layer were involved with the tumor.  A margin was taken of this (trapezius muscle). ? ?Surgical clips were placed at the borders of the resection in case radiation is needed.  Hemostasis was achieved with cautery and suture ligation.  The cavity was irrigated.  The skin was then closed in layers with interrupted 3-0 Vicryl deep dermal sutures, and 4-0 Monocryl running subcuticular suture.  The skin was then cleaned, dried, and dressed with dermabond. ? ?Patient was then flipped back supine on the stretcher.  She was allowed to emerge from general anesthesia.  She was then taken to the  PACU in stable condition.  Needle, sponge, and instrument counts were correct x2. ?  ? ?

## 2021-03-25 ENCOUNTER — Encounter (HOSPITAL_COMMUNITY): Payer: Self-pay | Admitting: General Surgery

## 2021-03-28 NOTE — Anesthesia Postprocedure Evaluation (Signed)
Anesthesia Post Note ? ?Patient: Christine Collier ? ?Procedure(s) Performed: EXCISION OF LEFT POSTERIOR NECK MASS (Left) ? ?  ? ?Patient location during evaluation: PACU ?Anesthesia Type: General ?Level of consciousness: sedated ?Pain management: pain level controlled ?Vital Signs Assessment: post-procedure vital signs reviewed and stable ?Respiratory status: spontaneous breathing and respiratory function stable ?Cardiovascular status: stable ?Postop Assessment: no apparent nausea or vomiting ?Anesthetic complications: no ? ? ?No notable events documented. ? ?Last Vitals:  ?Vitals:  ? 03/24/21 1545 03/24/21 1600  ?BP: (!) 147/82 (!) 149/67  ?Pulse: 72 68  ?Resp: 20 15  ?Temp:  36.7 ?C  ?SpO2: 100% 99%  ?  ?Last Pain:  ?Vitals:  ? 03/24/21 1600  ?TempSrc:   ?PainSc: 3   ? ? ?  ?  ?  ?  ?  ?  ? ?Merlinda Frederick ? ? ? ? ?

## 2021-03-31 LAB — SURGICAL PATHOLOGY

## 2021-05-05 ENCOUNTER — Telehealth: Payer: Self-pay | Admitting: Adult Health

## 2021-05-05 NOTE — Telephone Encounter (Signed)
Please call to schedule an appt. Last seen 9/22, RTC in 6 months.  ?

## 2021-05-05 NOTE — Telephone Encounter (Signed)
LVM follow up due. Call office to schedule apt  ?

## 2021-05-05 NOTE — Telephone Encounter (Signed)
Pt LVM @ 9:51a.  She would like refill of Cymbalta sent to [facility] in [place]. ? ?No upcoming appts scheduled ?

## 2021-06-08 ENCOUNTER — Other Ambulatory Visit: Payer: Self-pay | Admitting: Adult Health

## 2021-06-08 DIAGNOSIS — F411 Generalized anxiety disorder: Secondary | ICD-10-CM

## 2021-06-09 NOTE — Telephone Encounter (Signed)
Please call patient to schedule appt. Last seen 09/16/20 with RTC in 6 mo.  Last month it was noted that patient was called to schedule also and no response.

## 2021-06-09 NOTE — Telephone Encounter (Signed)
Pt said that she will call back to schedule she was at work.

## 2021-06-16 NOTE — Telephone Encounter (Signed)
Called patient and she said she would have to check her work schedule to see when she could schedule.

## 2021-06-22 NOTE — Telephone Encounter (Signed)
We can send in enough to last to next appt.

## 2021-07-04 ENCOUNTER — Other Ambulatory Visit: Payer: Self-pay

## 2021-07-04 ENCOUNTER — Telehealth: Payer: Self-pay | Admitting: Adult Health

## 2021-07-04 DIAGNOSIS — F411 Generalized anxiety disorder: Secondary | ICD-10-CM

## 2021-07-04 MED ORDER — ALPRAZOLAM 0.25 MG PO TABS: 0.2500 mg | ORAL_TABLET | Freq: Three times a day (TID) | ORAL | 0 refills | Status: DC | PRN

## 2021-07-21 ENCOUNTER — Ambulatory Visit: Payer: 59 | Admitting: Adult Health

## 2021-10-19 ENCOUNTER — Ambulatory Visit: Payer: 59 | Admitting: Adult Health

## 2021-10-28 ENCOUNTER — Ambulatory Visit (INDEPENDENT_AMBULATORY_CARE_PROVIDER_SITE_OTHER): Payer: Medicare Other | Admitting: Adult Health

## 2021-10-28 ENCOUNTER — Encounter: Payer: Self-pay | Admitting: Adult Health

## 2021-10-28 DIAGNOSIS — G4700 Insomnia, unspecified: Secondary | ICD-10-CM

## 2021-10-28 DIAGNOSIS — F411 Generalized anxiety disorder: Secondary | ICD-10-CM

## 2021-10-28 DIAGNOSIS — F410 Panic disorder [episodic paroxysmal anxiety] without agoraphobia: Secondary | ICD-10-CM | POA: Diagnosis not present

## 2021-10-28 DIAGNOSIS — F331 Major depressive disorder, recurrent, moderate: Secondary | ICD-10-CM

## 2021-10-28 MED ORDER — DULOXETINE HCL 30 MG PO CPEP: 30.0000 mg | ORAL_CAPSULE | Freq: Every day | ORAL | 1 refills | Status: DC

## 2021-10-28 MED ORDER — ALPRAZOLAM 0.25 MG PO TABS: 0.2500 mg | ORAL_TABLET | Freq: Three times a day (TID) | ORAL | 2 refills | Status: DC | PRN

## 2021-10-28 NOTE — Progress Notes (Signed)
Christine Collier [id number] 1955-05-10 66 y.o.  Subjective:   Patient ID:  Christine Collier is a 66 y.o. (DOB [birth year]) female.  Chief Complaint: No chief complaint on file.   HPI Christine Collier presents to the office today for follow-up of anxiety, depression and insomnia.  Describes mood today as "ok". Pleasant. Denies tearfulness. Mood symptoms - reports increased depression and anxiety. Feels irritable at times. Denies panic attacks. Reports increased worry and rumination. Reports over thinking. Stating "I'm not doing too good". Has stopped both the Cymbalta and Xanax. Willing to consider other options. Seeing PCP regularly. Stable interest and motivation Taking medications as prescribed.  Energy levels stable. Active, does not have a regular exercise routine.  Enjoys some usual interests and activities. Married. Lives with husband of 21 years. Children doing well. Parents in [place]. Sister at [facility]. Spending time with family Appetite adequate. Weight loss - 10 pounds. Sleeping more - averages 10 to 12 hours. Focus and concentration stable. Completing tasks. Managing aspects of household. Recently retired. Denies SI or HI.  Denies AH or VH.   Flowsheet Row Admission (Discharged) from 03/24/2021 in [place] from 03/22/2021 in [facility] PREADMISSION TESTING  C-SSRS RISK CATEGORY No Risk No Risk        Review of Systems:  Review of Systems  Musculoskeletal:  Negative for gait problem.  Neurological:  Negative for tremors.  Psychiatric/Behavioral:         Please refer to HPI    Medications: I have reviewed the patient's current medications.  Current Outpatient Medications  Medication Sig Dispense Refill   ALPRAZolam (XANAX) 0.25 MG tablet Take 1 tablet (0.25 mg total) by mouth 3 (three) times daily as needed. 90 tablet 2   aspirin 325 MG tablet Take 325 mg by mouth daily as needed for  headache.     baclofen (LIORESAL) 10 MG tablet Take 1 tablet (10 mg total) by mouth 3 (three) times daily as needed for muscle spasms. 30 tablet 1   DULoxetine (CYMBALTA) 30 MG capsule Take 1 capsule (30 mg total) by mouth daily. 90 capsule 1   hydroxypropyl methylcellulose / hypromellose (ISOPTO TEARS / GONIOVISC) 2.5 % ophthalmic solution Place 1 drop into both eyes 4 (four) times daily as needed for dry eyes.     ibuprofen (ADVIL) 200 MG tablet Take 200 mg by mouth every 6 (six) hours as needed for moderate pain or headache.     levothyroxine (SYNTHROID, LEVOTHROID) 75 MCG tablet Take 75 mcg by mouth daily before breakfast.     oxyCODONE (OXY IR/ROXICODONE) 5 MG immediate release tablet Take 1 tablet (5 mg total) by mouth every 6 (six) hours as needed for severe pain. 10 tablet 0   No current facility-administered medications for this visit.    Medication Side Effects: None  Allergies:  Allergies  Allergen Reactions   Polysporin [Bacitracin-Polymyxin B] Swelling    Only at application site   Sulfa Antibiotics     Other reaction(s): Unknown    Past Medical History:  Diagnosis Date   Anxiety    Depression    Thyroid cancer [facility])     Past Medical History, Surgical history, Social history, and Family history were reviewed and updated as appropriate.   Please see review of systems for further details on the patient's review from today.   Objective:   Physical Exam:  There were no vitals taken for this visit.  Physical Exam Constitutional:  General: She is not in acute distress. Musculoskeletal:        General: No deformity.  Neurological:     Mental Status: She is alert and oriented to person, place, and time.     Coordination: Coordination normal.  Psychiatric:        Attention and Perception: Attention and perception normal. She does not perceive auditory or visual hallucinations.        Mood and Affect: Mood normal. Mood is not anxious or depressed. Affect is not  labile, blunt, angry or inappropriate.        Speech: Speech normal.        Behavior: Behavior normal.        Thought Content: Thought content normal. Thought content is not paranoid or delusional. Thought content does not include homicidal or suicidal ideation. Thought content does not include homicidal or suicidal plan.        Cognition and Memory: Cognition and memory normal.        Judgment: Judgment normal.     Comments: Insight intact     Lab Review:     Component Value Date/Time   NA 143 10/10/2017 1414   K 4.4 10/10/2017 1414   CL 103 10/10/2017 1414   CO2 25 10/10/2017 1414   GLUCOSE 89 10/10/2017 1414   BUN 15 10/10/2017 1414   CREATININE 0.77 10/10/2017 1414   CALCIUM 9.4 10/10/2017 1414   PROT 6.0 10/10/2017 1414   ALBUMIN 4.0 10/10/2017 1414   AST 12 10/10/2017 1414   ALT 14 10/10/2017 1414   ALKPHOS 58 10/10/2017 1414   BILITOT 0.5 10/10/2017 1414   GFRNONAA 84 10/10/2017 1414   GFRAA 96 10/10/2017 1414       Component Value Date/Time   WBC 6.5 03/22/2021 1517   RBC 4.85 03/22/2021 1517   HGB 14.9 03/22/2021 1517   HGB 15.1 10/10/2017 1414   HCT 45.9 03/22/2021 1517   HCT 43.7 10/10/2017 1414   PLT 346 03/22/2021 1517   PLT 360 10/10/2017 1414   MCV 94.6 03/22/2021 1517   MCV 89 10/10/2017 1414   MCH 30.7 03/22/2021 1517   MCHC 32.5 03/22/2021 1517   RDW 13.0 03/22/2021 1517   RDW 12.4 10/10/2017 1414    No results found for: "POCLITH", "LITHIUM"   No results found for: "PHENYTOIN", "PHENOBARB", "VALPROATE", "CBMZ"   .res Assessment: Plan:    Plan:  1. Xanax 0.'25mg'$  - TID prn anxiety 2. Cymbalta '30mg'$  daily  Continue therapy  RTC 6 months  Patient advised to contact office with any questions, adverse effects, or acute worsening in signs and symptoms.  Discussed potential benefits, risk, and side effects of benzodiazepines to include potential risk of tolerance and dependence, as well as possible drowsiness.  Advised patient not to drive  if experiencing drowsiness and to take lowest possible effective dose to minimize risk of dependence and tolerance.  Diagnoses and all orders for this visit:  Major depressive disorder, recurrent episode, moderate (HCC) -     DULoxetine (CYMBALTA) 30 MG capsule; Take 1 capsule (30 mg total) by mouth daily.  Generalized anxiety disorder -     DULoxetine (CYMBALTA) 30 MG capsule; Take 1 capsule (30 mg total) by mouth daily. -     ALPRAZolam (XANAX) 0.25 MG tablet; Take 1 tablet (0.25 mg total) by mouth 3 (three) times daily as needed.  Insomnia, unspecified type -     DULoxetine (CYMBALTA) 30 MG capsule; Take 1 capsule (30 mg total) by mouth  daily.  Panic attacks     Please see After Visit Summary for patient specific instructions.  No future appointments.  No orders of the defined types were placed in this encounter.   -------------------------------

## 2021-11-02 ENCOUNTER — Other Ambulatory Visit: Payer: Self-pay

## 2021-11-02 MED ORDER — SERTRALINE HCL 50 MG PO TABS: 50.0000 mg | ORAL_TABLET | Freq: Every day | ORAL | 0 refills | Status: DC

## 2021-12-09 ENCOUNTER — Other Ambulatory Visit: Payer: Self-pay | Admitting: Adult Health

## 2021-12-09 DIAGNOSIS — F331 Major depressive disorder, recurrent, moderate: Secondary | ICD-10-CM

## 2021-12-09 MED ORDER — VENLAFAXINE HCL ER 37.5 MG PO CP24: ORAL_CAPSULE | ORAL | 2 refills | Status: DC

## 2022-03-03 ENCOUNTER — Ambulatory Visit: Payer: Medicare Other | Admitting: Adult Health

## 2022-03-14 ENCOUNTER — Ambulatory Visit (INDEPENDENT_AMBULATORY_CARE_PROVIDER_SITE_OTHER): Payer: Medicare Other | Admitting: Adult Health

## 2022-03-14 DIAGNOSIS — F489 Nonpsychotic mental disorder, unspecified: Secondary | ICD-10-CM

## 2022-03-14 NOTE — Progress Notes (Signed)
Patient no show appointment - 15 mins late.

## 2022-03-20 ENCOUNTER — Other Ambulatory Visit: Payer: Self-pay | Admitting: Adult Health

## 2022-03-20 DIAGNOSIS — F331 Major depressive disorder, recurrent, moderate: Secondary | ICD-10-CM

## 2022-03-20 NOTE — Telephone Encounter (Signed)
Pt is scheduled  03/29/22

## 2022-03-20 NOTE — Telephone Encounter (Signed)
Please call to schedule an appt. No showed or canceled/NS last 2 appts.

## 2022-03-29 ENCOUNTER — Encounter: Payer: Self-pay | Admitting: Adult Health

## 2022-03-29 ENCOUNTER — Ambulatory Visit (INDEPENDENT_AMBULATORY_CARE_PROVIDER_SITE_OTHER): Payer: Medicare Other | Admitting: Adult Health

## 2022-03-29 DIAGNOSIS — F411 Generalized anxiety disorder: Secondary | ICD-10-CM | POA: Diagnosis not present

## 2022-03-29 DIAGNOSIS — F331 Major depressive disorder, recurrent, moderate: Secondary | ICD-10-CM

## 2022-03-29 MED ORDER — VENLAFAXINE HCL ER 37.5 MG PO CP24: ORAL_CAPSULE | ORAL | 5 refills | Status: DC

## 2022-03-29 NOTE — Progress Notes (Signed)
EKAM KUI [id number] 1955/05/02 67 y.o.  Subjective:   Patient ID:  Christine Collier is a 67 y.o. (DOB 1955/11/20) female.  Chief Complaint: No chief complaint on file.   HPI ROSELIND DESANTOS presents to the office today for follow-up of anxiety and depression.  Describes mood today as "ok". Pleasant. Denies tearfulness. Mood symptoms - reports increased depression and anxiety. Denies irritability. Denies recent panic attacks. Reports worry, rumination, and over thinking. Mood  is variable. Stating "I'm doing better than I was". Currently taking Effexor 75mg  daily. Willing to consider other options. Seeing PCP regularly. Stable interest and motivation Taking medications as prescribed.  Energy levels stable. Active, does not have a regular exercise routine.  Enjoys some usual interests and activities. Married. Lives with husband of 35 years. Children doing well. Parents in [place]. Sister at [facility]. Father - age 13. Spending time with family Appetite adequate. Weight stable.   Sleeping more - averages 10 to 12 hours. Focus and concentration stable. Completing tasks. Managing aspects of household. Recently retired. Denies SI or HI.  Denies AH or VH. Denies self harm. Denies substance use.   Flowsheet Row Admission (Discharged) from 03/24/2021 in [place] from 03/22/2021 in [facility] PREADMISSION TESTING  C-SSRS RISK CATEGORY No Risk No Risk        Review of Systems:  Review of Systems  Musculoskeletal:  Negative for gait problem.  Neurological:  Negative for tremors.  Psychiatric/Behavioral:         Please refer to HPI    Medications: I have reviewed the patient's current medications.  Current Outpatient Medications  Medication Sig Dispense Refill   ALPRAZolam (XANAX) 0.25 MG tablet Take 1 tablet (0.25 mg total) by mouth 3 (three) times daily as needed. 90 tablet 2   aspirin 325 MG tablet  Take 325 mg by mouth daily as needed for headache.     DULoxetine (CYMBALTA) 30 MG capsule Take 1 capsule (30 mg total) by mouth daily. 90 capsule 1   hydroxypropyl methylcellulose / hypromellose (ISOPTO TEARS / GONIOVISC) 2.5 % ophthalmic solution Place 1 drop into both eyes 4 (four) times daily as needed for dry eyes.     ibuprofen (ADVIL) 200 MG tablet Take 200 mg by mouth every 6 (six) hours as needed for moderate pain or headache.     levothyroxine (SYNTHROID, LEVOTHROID) 75 MCG tablet Take 75 mcg by mouth daily before breakfast.     oxyCODONE (OXY IR/ROXICODONE) 5 MG immediate release tablet Take 1 tablet (5 mg total) by mouth every 6 (six) hours as needed for severe pain. 10 tablet 0   sertraline (ZOLOFT) 50 MG tablet Take 1 tablet (50 mg total) by mouth daily. Take 1/2 tablet for 3 days and then 1 tablet daily thereafter. 30 tablet 0   venlafaxine XR (EFFEXOR-XR) 37.5 MG 24 hr capsule TAKE 2 CAPSULES EVERY MORNING 60 capsule 0   No current facility-administered medications for this visit.    Medication Side Effects: None  Allergies:  Allergies  Allergen Reactions   Polysporin [Bacitracin-Polymyxin B] Swelling    Only at application site   Sulfa Antibiotics     Other reaction(s): Unknown    Past Medical History:  Diagnosis Date   Anxiety    Depression    Thyroid cancer [facility])     Past Medical History, Surgical history, Social history, and Family history were reviewed and updated as appropriate.   Please see review of systems  for further details on the patient's review from today.   Objective:   Physical Exam:  There were no vitals taken for this visit.  Physical Exam Constitutional:      General: She is not in acute distress. Musculoskeletal:        General: No deformity.  Neurological:     Mental Status: She is alert and oriented to person, place, and time.     Coordination: Coordination normal.  Psychiatric:        Attention and Perception: Attention and  perception normal. She does not perceive auditory or visual hallucinations.        Mood and Affect: Mood normal. Mood is not anxious or depressed. Affect is not labile, blunt, angry or inappropriate.        Speech: Speech normal.        Behavior: Behavior normal.        Thought Content: Thought content normal. Thought content is not paranoid or delusional. Thought content does not include homicidal or suicidal ideation. Thought content does not include homicidal or suicidal plan.        Cognition and Memory: Cognition and memory normal.        Judgment: Judgment normal.     Comments: Insight intact     Lab Review:     Component Value Date/Time   NA 143 10/10/2017 1414   K 4.4 10/10/2017 1414   CL 103 10/10/2017 1414   CO2 25 10/10/2017 1414   GLUCOSE 89 10/10/2017 1414   BUN 15 10/10/2017 1414   CREATININE 0.77 10/10/2017 1414   CALCIUM 9.4 10/10/2017 1414   PROT 6.0 10/10/2017 1414   ALBUMIN 4.0 10/10/2017 1414   AST 12 10/10/2017 1414   ALT 14 10/10/2017 1414   ALKPHOS 58 10/10/2017 1414   BILITOT 0.5 10/10/2017 1414   GFRNONAA 84 10/10/2017 1414   GFRAA 96 10/10/2017 1414       Component Value Date/Time   WBC 6.5 03/22/2021 1517   RBC 4.85 03/22/2021 1517   HGB 14.9 03/22/2021 1517   HGB 15.1 10/10/2017 1414   HCT 45.9 03/22/2021 1517   HCT 43.7 10/10/2017 1414   PLT 346 03/22/2021 1517   PLT 360 10/10/2017 1414   MCV 94.6 03/22/2021 1517   MCV 89 10/10/2017 1414   MCH 30.7 03/22/2021 1517   MCHC 32.5 03/22/2021 1517   RDW 13.0 03/22/2021 1517   RDW 12.4 10/10/2017 1414    No results found for: "POCLITH", "LITHIUM"   No results found for: "PHENYTOIN", "PHENOBARB", "VALPROATE", "CBMZ"   .res Assessment: Plan:    Plan:  Xanax 0.25mg  - TID prn anxiety Increase Effexor 37.5mg  - 2 daily to 3 daily  Discussed NAC tabs  Continue therapy  RTC 6 months  Patient advised to contact office with any questions, adverse effects, or acute worsening in signs and  symptoms.  Discussed potential benefits, risk, and side effects of benzodiazepines to include potential risk of tolerance and dependence, as well as possible drowsiness.  Advised patient not to drive if experiencing drowsiness and to take lowest possible effective dose to minimize risk of dependence and tolerance.  There are no diagnoses linked to this encounter.   Please see After Visit Summary for patient specific instructions.  No future appointments.  No orders of the defined types were placed in this encounter.  ------------------------------

## 2022-04-28 ENCOUNTER — Other Ambulatory Visit: Payer: Self-pay | Admitting: Adult Health

## 2022-04-28 DIAGNOSIS — F411 Generalized anxiety disorder: Secondary | ICD-10-CM

## 2022-07-18 ENCOUNTER — Other Ambulatory Visit: Payer: Self-pay | Admitting: Adult Health

## 2022-07-18 DIAGNOSIS — F411 Generalized anxiety disorder: Secondary | ICD-10-CM

## 2022-10-03 ENCOUNTER — Other Ambulatory Visit: Payer: Self-pay | Admitting: Adult Health

## 2022-10-03 DIAGNOSIS — F411 Generalized anxiety disorder: Secondary | ICD-10-CM

## 2022-10-03 NOTE — Telephone Encounter (Signed)
Please call to schedule an appt, due this month.

## 2022-10-05 ENCOUNTER — Other Ambulatory Visit: Payer: Self-pay

## 2022-10-06 NOTE — Telephone Encounter (Signed)
Pt is scheduled for 10/02

## 2022-10-10 ENCOUNTER — Ambulatory Visit: Payer: Medicare Other | Admitting: Adult Health

## 2022-10-13 ENCOUNTER — Encounter: Payer: Self-pay | Admitting: Adult Health

## 2022-10-13 ENCOUNTER — Ambulatory Visit: Payer: Medicare Other | Admitting: Adult Health

## 2022-10-13 DIAGNOSIS — F411 Generalized anxiety disorder: Secondary | ICD-10-CM

## 2022-10-13 DIAGNOSIS — F331 Major depressive disorder, recurrent, moderate: Secondary | ICD-10-CM | POA: Diagnosis not present

## 2022-10-13 MED ORDER — ALPRAZOLAM 0.25 MG PO TABS: 0.2500 mg | ORAL_TABLET | Freq: Three times a day (TID) | ORAL | 2 refills | Status: DC | PRN

## 2022-10-13 NOTE — Progress Notes (Signed)
Christine Collier [id number] [birth year] 67 y.o.  Subjective:   Patient ID:  Christine Collier is a 67 y.o. (DOB [birth year]) female.  Chief Complaint: No chief complaint on file.   HPI Christine Collier presents to the office today for follow-up of anxiety and depression.  Describes mood today as "ok". Pleasant. Denies tearfulness. Mood symptoms - reports some depression and anxiety. Denies irritability. Denies recent panic attacks. Reports worry, rumination, and over thinking. Mood is stable. Stating "I'm doing the best I can". Reports additional stressors with parents health and care. Feels like current medication regimen is helpful. Seeing PCP regularly. Stable interest and motivation Taking medications as prescribed.  Energy levels stable. Active, does not have a regular exercise routine.  Enjoys some usual interests and activities. Married. Lives with husband. Children doing well. Spending time with family Appetite adequate. Weight stable.   Reports sleeping better some nights than others with current situational stressors.   Focus and concentration stable. Completing tasks. Managing aspects of household. Recently retired. Denies SI or HI.  Denies AH or VH. Denies self harm. Denies substance use.   Flowsheet Row Admission (Discharged) from 03/24/2021 in [place] PERIOPERATIVE AREA Pre-Admission Testing 67 from 03/22/2021 in [facility] PREADMISSION TESTING  C-SSRS RISK CATEGORY No Risk No Risk        Review of Systems:  Review of Systems  Musculoskeletal:  Negative for gait problem.  Neurological:  Negative for tremors.  Psychiatric/Behavioral:         Please refer to HPI    Medications: I have reviewed the patient's current medications.  Current Outpatient Medications  Medication Sig Dispense Refill   ALPRAZolam (XANAX) 0.25 MG tablet Take 1 tablet by mouth three times daily as needed 15 tablet 0   aspirin 325 MG tablet Take 325 mg by mouth daily as  needed for headache.     hydroxypropyl methylcellulose / hypromellose (ISOPTO TEARS / GONIOVISC) 2.5 % ophthalmic solution Place 1 drop into both eyes 4 (four) times daily as needed for dry eyes.     ibuprofen (ADVIL) 200 MG tablet Take 200 mg by mouth every 6 (six) hours as needed for moderate pain or headache.     levothyroxine (SYNTHROID, LEVOTHROID) 75 MCG tablet Take 75 mcg by mouth daily before breakfast.     oxyCODONE (OXY IR/ROXICODONE) 5 MG immediate release tablet Take 1 tablet (5 mg total) by mouth every 6 (six) hours as needed for severe pain. 10 tablet 0   venlafaxine XR (EFFEXOR-XR) 37.5 MG 24 hr capsule TAKE 3 CAPSULES EVERY MORNING 90 capsule 5   No current facility-administered medications for this visit.    Medication Side Effects: None  Allergies:  Allergies  Allergen Reactions   Polysporin [Bacitracin-Polymyxin B] Swelling    Only at application site   Sulfa Antibiotics     Other reaction(s): Unknown    Past Medical History:  Diagnosis Date   Anxiety    Depression    Thyroid cancer [facility])     Past Medical History, Surgical history, Social history, and Family history were reviewed and updated as appropriate.   Please see review of systems for further details on the patient's review from today.   Objective:   Physical Exam:  There were no vitals taken for this visit.  Physical Exam Constitutional:      General: She is not in acute distress. Musculoskeletal:        General: No deformity.  Neurological:     Mental  Status: She is alert and oriented to person, place, and time.     Coordination: Coordination normal.  Psychiatric:        Attention and Perception: Attention and perception normal. She does not perceive auditory or visual hallucinations.        Mood and Affect: Affect is not labile, blunt, angry or inappropriate.        Speech: Speech normal.        Behavior: Behavior normal.        Thought Content: Thought content normal. Thought content is  not paranoid or delusional. Thought content does not include homicidal or suicidal ideation. Thought content does not include homicidal or suicidal plan.        Cognition and Memory: Cognition and memory normal.        Judgment: Judgment normal.     Comments: Insight intact     Lab Review:     Component Value Date/Time   NA 143 10/10/2017 1414   K 4.4 10/10/2017 1414   CL 103 10/10/2017 1414   CO2 25 10/10/2017 1414   GLUCOSE 89 10/10/2017 1414   BUN 15 10/10/2017 1414   CREATININE 0.77 10/10/2017 1414   CALCIUM 9.4 10/10/2017 1414   PROT 6.0 10/10/2017 1414   ALBUMIN 4.0 10/10/2017 1414   AST 12 10/10/2017 1414   ALT 14 10/10/2017 1414   ALKPHOS 58 10/10/2017 1414   BILITOT 0.5 10/10/2017 1414   GFRNONAA 84 10/10/2017 1414   GFRAA 96 10/10/2017 1414       Component Value Date/Time   WBC 6.5 03/22/2021 1517   RBC 4.85 03/22/2021 1517   HGB 14.9 03/22/2021 1517   HGB 15.1 10/10/2017 1414   HCT 45.9 03/22/2021 1517   HCT 43.7 10/10/2017 1414   PLT 346 03/22/2021 1517   PLT 360 10/10/2017 1414   MCV 94.6 03/22/2021 1517   MCV 89 10/10/2017 1414   MCH 30.7 03/22/2021 1517   MCHC 32.5 03/22/2021 1517   RDW 13.0 03/22/2021 1517   RDW 12.4 10/10/2017 1414    No results found for: "POCLITH", "LITHIUM"   No results found for: "PHENYTOIN", "PHENOBARB", "VALPROATE", "CBMZ"   .res Assessment: Plan:    Plan:  Xanax 0.25mg  - TID prn anxiety Effexor 37.5mg  - 3 daily  Continue therapy  RTC 6 months  Patient advised to contact office with any questions, adverse effects, or acute worsening in signs and symptoms.  Discussed potential benefits, risk, and side effects of benzodiazepines to include potential risk of tolerance and dependence, as well as possible drowsiness.  Advised patient not to drive if experiencing drowsiness and to take lowest possible effective dose to minimize risk of dependence and tolerance.  There are no diagnoses linked to this encounter.    Please see After Visit Summary for patient specific instructions.  Future Appointments  Date Time Provider Department Center  10/13/2022  3:40 PM Sunil Hue, Thereasa Solo, NP [facility] None    No orders of the defined types were placed in this encounter.   -------------------------------

## 2022-12-15 ENCOUNTER — Other Ambulatory Visit: Payer: Self-pay | Admitting: Adult Health

## 2022-12-15 DIAGNOSIS — F331 Major depressive disorder, recurrent, moderate: Secondary | ICD-10-CM

## 2023-04-09 ENCOUNTER — Other Ambulatory Visit: Payer: Self-pay | Admitting: Adult Health

## 2023-04-09 DIAGNOSIS — F331 Major depressive disorder, recurrent, moderate: Secondary | ICD-10-CM

## 2023-04-13 ENCOUNTER — Ambulatory Visit: Payer: Medicare Other | Admitting: Adult Health

## 2023-04-13 ENCOUNTER — Encounter: Payer: Self-pay | Admitting: Adult Health

## 2023-04-13 DIAGNOSIS — F32A Depression, unspecified: Secondary | ICD-10-CM | POA: Diagnosis not present

## 2023-04-13 DIAGNOSIS — F419 Anxiety disorder, unspecified: Secondary | ICD-10-CM

## 2023-04-13 DIAGNOSIS — F411 Generalized anxiety disorder: Secondary | ICD-10-CM

## 2023-04-13 DIAGNOSIS — F331 Major depressive disorder, recurrent, moderate: Secondary | ICD-10-CM

## 2023-04-13 NOTE — Progress Notes (Signed)
 Christine FULTS [id number] 10/06/1955 68 y.o.  Virtual Visit via Telephone Note  I connected with pt on 04/13/23 at  2:00 PM EDT by telephone and verified that I am speaking with the correct person using two identifiers.   I discussed the limitations, risks, security and privacy concerns of performing an evaluation and management service by telephone and the availability of in person appointments. I also discussed with the patient that there may be a patient responsible charge related to this service. The patient expressed understanding and agreed to proceed.   I discussed the assessment and treatment plan with the patient. The patient was provided an opportunity to ask questions and all were answered. The patient agreed with the plan and demonstrated an understanding of the instructions.   The patient was advised to call back or seek an in-person evaluation if the symptoms worsen or if the condition fails to improve as anticipated.  I provided 20 minutes of non-face-to-face time during this encounter.  The patient was located at home.  The provider was located at [facility].   Dorothyann Gibbs, NP   Subjective:   Patient ID:  Christine Collier is a 68 y.o. (DOB 11/24/1955) female.  Chief Complaint: No chief complaint on file.   HPI DOLCE Christine presents for follow-up of anxiety and depression.  Describes mood today as "ok". Pleasant. Denies tearfulness. Mood symptoms - reports some depression and anxiety - "manageable, but always fighting it". Reports stable interest and motivation Denies irritability. Denies recent panic attacks. Reports some worry, rumination, and over thinking. Reports mood is stable. Stating "I feel like I'm doing ok". Feels like current medication regimen is helpful. . Taking medications as prescribed.  Energy levels stable. Active, does not have a regular exercise routine.  Enjoys some usual interests and activities. Married. Lives with  husband. Children doing well. Father passed away last year - mother (70) still at home. Spending time with family Appetite adequate. Weight stable.   Reports sleeping well most nights. Averages 10 hours a night.   Focus and concentration stable. Completing tasks. Managing aspects of household. 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 Denies SI or HI.  Denies AH or VH. Denies self harm. Denies substance use.  Review of Systems:  Review of Systems  Musculoskeletal:  Negative for gait problem.  Neurological:  Negative for tremors.  Psychiatric/Behavioral:         Please refer to HPI    Medications: I have reviewed the patient's current medications.  Current Outpatient Medications  Medication Sig Dispense Refill   ALPRAZolam (XANAX) 0.25 MG tablet Take 1 tablet (0.25 mg total) by mouth 3 (three) times daily as needed. 90 tablet 2   aspirin 325 MG tablet Take 325 mg by mouth daily as needed for headache.     hydroxypropyl methylcellulose / hypromellose (ISOPTO TEARS / GONIOVISC) 2.5 % ophthalmic solution Place 1 drop into both eyes 4 (four) times daily as needed for dry eyes.     ibuprofen (ADVIL) 200 MG tablet Take 200 mg by mouth every 6 (six) hours as needed for moderate pain or headache.     levothyroxine (SYNTHROID, LEVOTHROID) 75 MCG tablet Take 75 mcg by mouth daily before breakfast.     oxyCODONE (OXY IR/ROXICODONE) 5 MG immediate release tablet Take 1 tablet (5 mg total) by mouth every 6 (six) hours as needed for severe pain. 10 tablet 0   venlafaxine XR (EFFEXOR-XR) 37.5 MG 24 hr capsule TAKE 3 CAPSULES  BY MOUTH IN THE MORNING 90 capsule 0   No current facility-administered medications for this visit.    Medication Side Effects: None  Allergies:  Allergies  Allergen Reactions   Polysporin [Bacitracin-Polymyxin B] Swelling    Only at application site   Sulfa Antibiotics     Other reaction(s): Unknown    Past Medical History:  Diagnosis Date   Anxiety    Depression     Thyroid cancer (HCC)     Family History  Problem Relation Age of Onset   Colon cancer Mother    Autoimmune disease Neg Hx     Social History   Socioeconomic History   Marital status: Married    Spouse name: Not on file   Number of children: 2   Years of education: Not on file   Highest education level: Associate degree: academic program  Occupational History   Not on file  Tobacco Use   Smoking status: Never   Smokeless tobacco: Never  Vaping Use   Vaping status: Never Used  Substance and Sexual Activity   Alcohol use: Yes    Comment: social   Drug use: Not Currently    Comment: smoked pot in past but not now   Sexual activity: Not on file  Other Topics Concern   Not on file  Social History Narrative   Lives at home with her husband and daughter   Right handed    Caffeine: 2 cups of coffee daily   Social Drivers of Corporate investment banker Strain: Not on file  Food Insecurity: Not on file  Transportation Needs: Not on file  Physical Activity: Not on file  Stress: Not on file  Social Connections: Not on file  Intimate Partner Violence: Not on file    Past Medical History, Surgical history, Social history, and Family history were reviewed and updated as appropriate.   Please see review of systems for further details on the patient's review from today.   Objective:   Physical Exam:  There were no vitals taken for this visit.  Physical Exam Constitutional:      General: She is not in acute distress. Musculoskeletal:        General: No deformity.  Neurological:     Mental Status: She is alert and oriented to person, place, and time.     Coordination: Coordination normal.  Psychiatric:        Attention and Perception: Attention and perception normal. She does not perceive auditory or visual hallucinations.        Mood and Affect: Affect is not labile, blunt, angry or inappropriate.        Speech: Speech normal.        Behavior: Behavior normal.         Thought Content: Thought content normal. Thought content is not paranoid or delusional. Thought content does not include homicidal or suicidal ideation. Thought content does not include homicidal or suicidal plan.        Cognition and Memory: Cognition and memory normal.        Judgment: Judgment normal.     Comments: Insight intact     Lab Review:     Component Value Date/Time   NA 143 10/10/2017 1414   K 4.4 10/10/2017 1414   CL 103 10/10/2017 1414   CO2 25 10/10/2017 1414   GLUCOSE 89 10/10/2017 1414   BUN 15 10/10/2017 1414   CREATININE 0.77 10/10/2017 1414   CALCIUM 9.4 10/10/2017 1414  PROT 6.0 10/10/2017 1414   ALBUMIN 4.0 10/10/2017 1414   AST 12 10/10/2017 1414   ALT 14 10/10/2017 1414   ALKPHOS 58 10/10/2017 1414   BILITOT 0.5 10/10/2017 1414   GFRNONAA 84 10/10/2017 1414   GFRAA 96 10/10/2017 1414       Component Value Date/Time   WBC 6.5 03/22/2021 1517   RBC 4.85 03/22/2021 1517   HGB 14.9 03/22/2021 1517   HGB 15.1 10/10/2017 1414   HCT 45.9 03/22/2021 1517   HCT 43.7 10/10/2017 1414   PLT 346 03/22/2021 1517   PLT 360 10/10/2017 1414   MCV 94.6 03/22/2021 1517   MCV 89 10/10/2017 1414   MCH 30.7 03/22/2021 1517   MCHC 32.5 03/22/2021 1517   RDW 13.0 03/22/2021 1517   RDW 12.4 10/10/2017 1414    No results found for: "POCLITH", "LITHIUM"   No results found for: "PHENYTOIN", "PHENOBARB", "VALPROATE", "CBMZ"   .res Assessment: Plan:    Plan:  Xanax 0.25mg  - TID prn anxiety Effexor 37.5mg  - 3 daily  Continue therapy  RTC 6 months  20 minutes spent dedicated to the care of this patient on the date of this encounter to include pre-visit review of records, ordering of medication, post visit documentation, and face-to-face time with the patient discussing depression and anxiety. Discussed continuing current medication regimen.  Patient advised to contact office with any questions, adverse effects, or acute worsening in signs and  symptoms.  Discussed potential benefits, risk, and side effects of benzodiazepines to include potential risk of tolerance and dependence, as well as possible drowsiness.  Advised patient not to drive if experiencing drowsiness and to take lowest possible effective dose to minimize risk of dependence and tolerance. There are no diagnoses linked to this encounter.  Please see After Visit Summary for patient specific instructions.  Future Appointments  Date Time Provider Department Center  04/13/2023  2:00 PM Alexandre Faries, Thereasa Solo, NP [facility] None    No orders of the defined types were placed in this encounter.     -------------------------------

## 2023-05-06 ENCOUNTER — Other Ambulatory Visit: Payer: Self-pay | Admitting: Adult Health

## 2023-05-06 DIAGNOSIS — F331 Major depressive disorder, recurrent, moderate: Secondary | ICD-10-CM

## 2023-05-10 ENCOUNTER — Other Ambulatory Visit: Payer: Self-pay | Admitting: Adult Health

## 2023-05-10 DIAGNOSIS — F411 Generalized anxiety disorder: Secondary | ICD-10-CM

## 2023-05-17 IMAGING — CT CT NECK W/ CM
3 of 4 series · 6 of 14 positions shown, 7 images · IV contrast (agent unspecified)
Comparison: None similar

CLINICAL DATA: Multiple thyroid nodules.  Lump in back of neck

EXAM:
CT NECK WITH CONTRAST
TECHNIQUE: Multidetector CT imaging of the neck was performed using the
standard protocol following the bolus administration of intravenous
contrast.

[Series 3: neck · axial · 0.47mm/px · z∈[+1030,+1112]mm · 2 of 124 slices shown]
[im 42/124  bone]
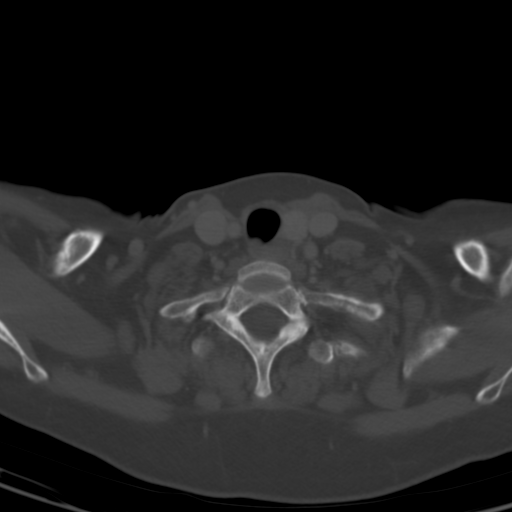
[im 83/124  bone]
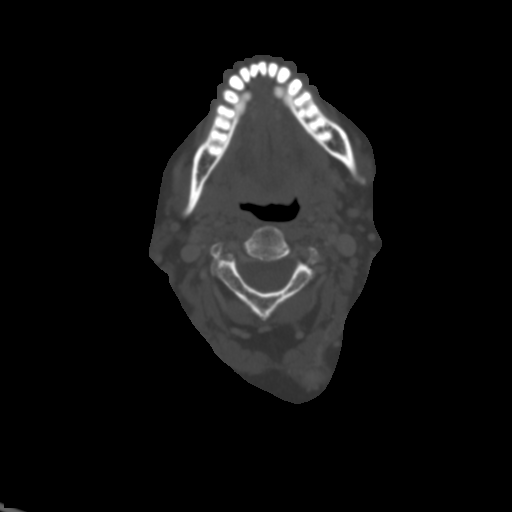

[Series 8: angled axial-oropharynx · axial · 0.39mm/px · z∈[+999,+1078]mm · 2 of 124 slices shown, 3 images]
[im 42/124  soft-tissue]
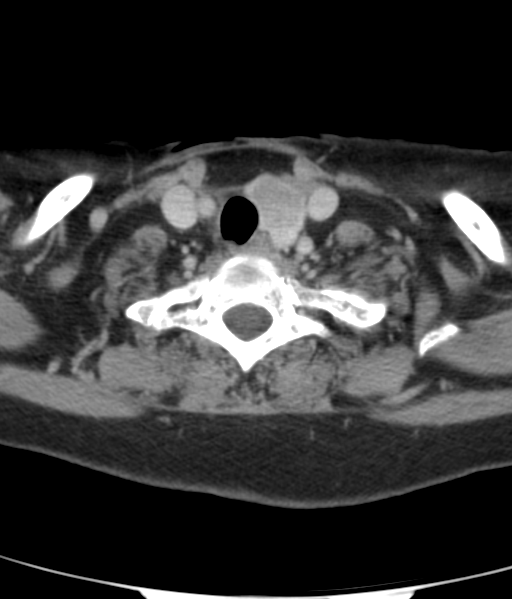
[im 42/124  bone]
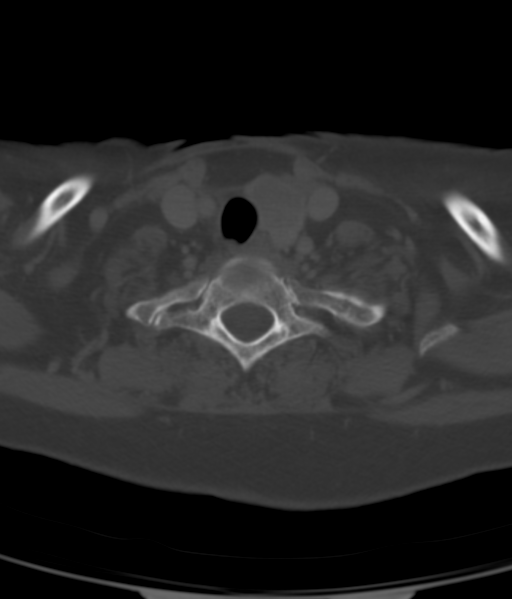
[im 83/124  bone]
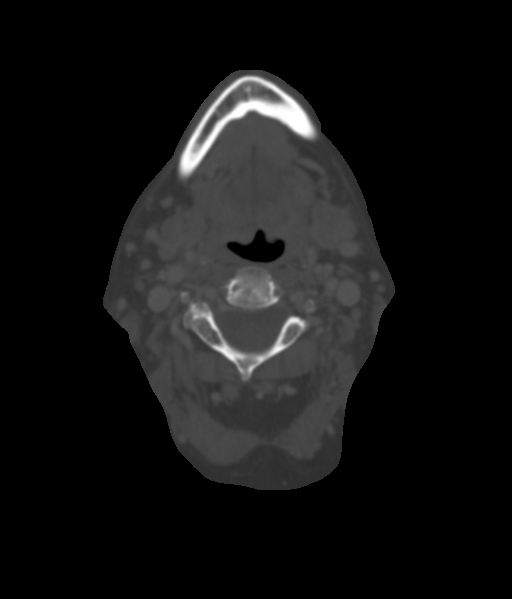

[Series 9: angled (person_name) · axial · 0.39mm/px · z∈[+1003,+1085]mm · 2 of 130 slices shown]
[im 44/130  bone]
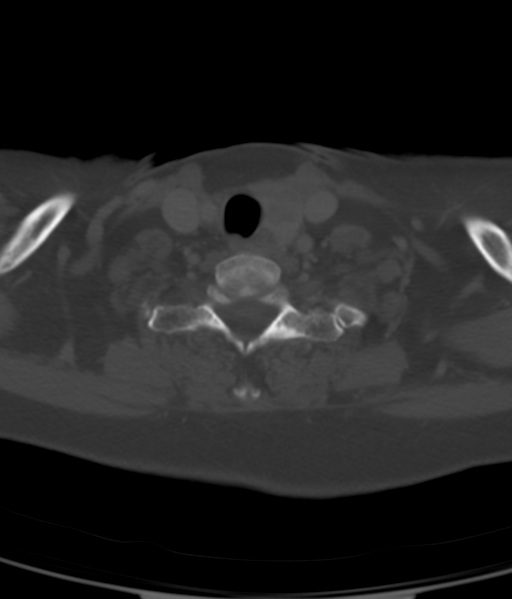
[im 87/130  bone]
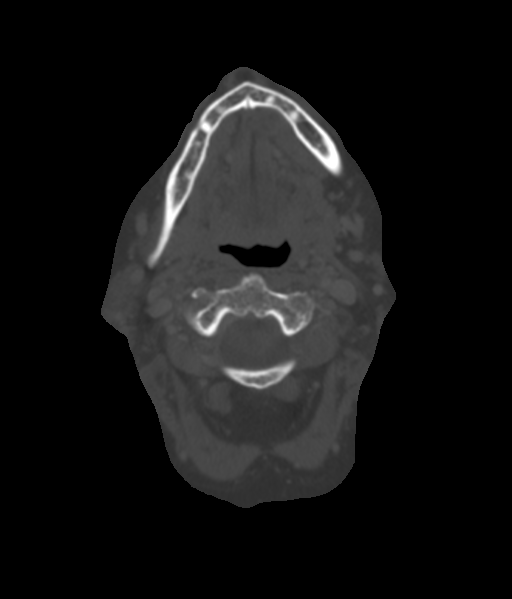

[6 of 14 positions shown; findings below may reference images not displayed]

RADIATION DOSE REDUCTION: This exam was performed according to the
departmental dose-optimization program which includes automated
exposure control, adjustment of the mA and/or kV according to
patient size and/or use of iterative reconstruction technique.

CONTRAST:  75mL [id number] IOPAMIDOL (EMW79F-X77) INJECTION 61%
FINDINGS: Pharynx and larynx: No masslike enhancement. Asymmetric effacement
of the left vallecula is likely apposition. Gaseous rightward
tracheal diverticulum at the thoracic inlet.

Salivary glands: No inflammation, mass, or stone.

Thyroid: Right hemithyroidectomy. Known left nodule followed by
biopsy and ultrasound.

Lymph nodes: 3.7 cm mass in the left posterior neck, centered in the
subcutaneous fat but broadly contacting intrinsic neck muscles. Very
avidly enhancing with recruited adjacent vessels. As a solitary
finding and contralateral and posterior to the patient's primary
cancer, this is most likely a primary mass, possibly sarcoma.

Vascular: Vessel recruitment to the described mass.

Limited intracranial: Negative

Visualized orbits: Negative

Mastoids and visualized paranasal sinuses: Clear

Skeleton: Spinal degeneration especially affecting facets. No acute
or aggressive finding.

Upper chest: 6 mm nodule in the subpleural right upper lobe.
IMPRESSION: 1. The palpable complaint in the left posterior neck is a 3.7 cm
solid mass. Distance from primary and solitary nature suggests this
is a separate process, possibly sarcoma. The mass is avidly
enhancing with recruitment of multiple vessels.
2. 6 mm nodule in the right upper lobe, recommend full chest CT but
consider delay until pathology is available from #1.

## 2023-10-22 ENCOUNTER — Ambulatory Visit: Admitting: Adult Health

## 2023-11-21 ENCOUNTER — Other Ambulatory Visit: Payer: Self-pay | Admitting: Adult Health

## 2023-11-21 DIAGNOSIS — F411 Generalized anxiety disorder: Secondary | ICD-10-CM

## 2023-11-30 ENCOUNTER — Ambulatory Visit: Admitting: Adult Health

## 2023-11-30 ENCOUNTER — Encounter: Payer: Self-pay | Admitting: Adult Health

## 2023-11-30 DIAGNOSIS — F411 Generalized anxiety disorder: Secondary | ICD-10-CM | POA: Diagnosis not present

## 2023-11-30 DIAGNOSIS — F331 Major depressive disorder, recurrent, moderate: Secondary | ICD-10-CM | POA: Diagnosis not present

## 2023-11-30 MED ORDER — ALPRAZOLAM 0.25 MG PO TABS: 0.2500 mg | ORAL_TABLET | Freq: Three times a day (TID) | ORAL | 2 refills | Status: AC | PRN

## 2023-11-30 MED ORDER — VENLAFAXINE HCL ER 37.5 MG PO CP24: ORAL_CAPSULE | ORAL | 5 refills | Status: AC

## 2023-11-30 NOTE — Progress Notes (Signed)
 Christine Collier [id number] [birth year] 68 y.o.  Subjective:   Patient ID:  Christine Collier is a 68 y.o. (DOB 1955-05-21) female.  Chief Complaint: No chief complaint on file.   HPI Christine Collier presents to the office today for follow-up of anxiety and depression.  Describes mood today as ok. Pleasant. Reports tearfulness. Mood symptoms - reports some depression, irritability and anxiety. Reports stable interest and motivation. Denies recent panic attacks. Reports some worry, rumination, and over thinking. Reports mood is variable. Stating I feel like I'm doing ok under the circumstances. Feels like current medication regimen is helpful. Taking medications as prescribed.  Energy levels stable. Active, does not have a regular exercise routine.  Enjoys some usual interests and activities. Married. Lives with husband. Children doing well. Father passed away last year - mother (74) living with sister. Spending time with family Appetite adequate. Weight stable.   Reports sleeping well most nights. Averages 8 hours a night.   Focus and concentration stable. Completing tasks. Managing aspects of household. Retired. Denies SI or HI.  Denies AH or VH. Denies self harm. Denies substance use.  Flowsheet Row Admission (Discharged) from 03/24/2021 in [facility] Pre-Admission Testing 60 from 03/22/2021 in [facility] PREADMISS[facility] TEST[facility]  C-SSRS RISK CATEGORY No Risk No Risk    Review of Systems:  Review of Systems  Musculoskeletal:  Negative for gait problem.  Neurological:  Negative for tremors.  Psychiatric/Behavioral:         Please refer to HPI    Medications: I have reviewed the patient's current medications.  Current Outpatient Medications  Medication Sig Dispense Refill   ALPRAZolam  (XANAX ) 0.25 MG tablet Take 1 tablet by mouth three times daily as needed 24 tablet 0   aspirin 325 MG tablet Take 325 mg by mouth daily as needed for  headache.     hydroxypropyl methylcellulose / hypromellose (ISOPTO TEARS / GONIOVISC) 2.5 % ophthalmic solution Place 1 drop into both eyes 4 (four) times daily as needed for dry eyes.     ibuprofen (ADVIL) 200 MG tablet Take 200 mg by mouth every 6 (six) hours as needed for moderate pain or headache.     levothyroxine (SYNTHROID, LEVOTHROID) 75 MCG tablet Take 75 mcg by mouth daily before breakfast.     oxyCODONE  (OXY IR/ROXICODONE ) 5 MG immediate release tablet Take 1 tablet (5 mg total) by mouth every 6 (six) hours as needed for severe pain. 10 tablet 0   venlafaxine  XR (EFFEXOR -XR) 37.5 MG 24 hr capsule TAKE 3 CAPSULES BY MOUTH IN THE MORNING 90 capsule 5   No current facility-administered medications for this visit.    Medication Side Effects: None  Allergies:  Allergies  Allergen Reactions   Polysporin [Bacitracin-Polymyxin B] Swelling    Only at application site   Sulfa Antibiotics     Other reaction(s): Unknown    Past Medical History:  Diagnosis Date   Anxiety    Depression    Thyroid  cancer [facility])     Past Medical History, Surgical history, Social history, and Family history were reviewed and updated as appropriate.   Please see review of systems for further details on the patient's review from today.   Objective:   Physical Exam:  There were no vitals taken for this visit.  Physical Exam Constitutional:      General: She is not in acute distress. Musculoskeletal:        General: No deformity.  Neurological:  Mental Status: She is alert and oriented to person, place, and time.     Coordination: Coordination normal.  Psychiatric:        Attention and Perception: Attention and perception normal. She does not perceive auditory or visual hallucinations.        Mood and Affect: Mood normal. Mood is not anxious or depressed. Affect is not labile, blunt, angry or inappropriate.        Speech: Speech normal.        Behavior: Behavior normal.        Thought  Content: Thought content normal. Thought content is not paranoid or delusional. Thought content does not include homicidal or suicidal ideation. Thought content does not include homicidal or suicidal plan.        Cognition and Memory: Cognition and memory normal.        Judgment: Judgment normal.     Comments: Insight intact     Lab Review:     Component Value Date/Time   NA 143 10/10/2017 1414   K 4.4 10/10/2017 1414   CL 103 10/10/2017 1414   CO2 25 10/10/2017 1414   GLUCOSE 89 10/10/2017 1414   BUN 15 10/10/2017 1414   CREATININE 0.77 10/10/2017 1414   CALCIUM 9.4 10/10/2017 1414   PROT 6.0 10/10/2017 1414   ALBUMIN 4.0 10/10/2017 1414   AST 12 10/10/2017 1414   ALT 14 10/10/2017 1414   ALKPHOS 58 10/10/2017 1414   BILITOT 0.5 10/10/2017 1414   GFRNONAA 84 10/10/2017 1414   GFRAA 96 10/10/2017 1414       Component Value Date/Time   WBC 6.5 03/22/2021 1517   RBC 4.85 03/22/2021 1517   HGB 14.9 03/22/2021 1517   HGB 15.1 10/10/2017 1414   HCT 45.9 03/22/2021 1517   HCT 43.7 10/10/2017 1414   PLT 346 03/22/2021 1517   PLT 360 10/10/2017 1414   MCV 94.6 03/22/2021 1517   MCV 89 10/10/2017 1414   MCH 30.7 03/22/2021 1517   MCHC 32.5 03/22/2021 1517   RDW 13.0 03/22/2021 1517   RDW 12.4 10/10/2017 1414    No results found for: POCLITH, LITHIUM   No results found for: PHENYTOIN, PHENOBARB, VALPROATE, CBMZ   .res Assessment: Plan:    Plan:  Xanax  0.25mg  - TID prn anxiety Effexor  37.5mg  - 3 daily  Continue therapy  RTC 6 months  20 minutes spent dedicated to the care of this patient on the date of this encounter to include pre-visit review of records, ordering of medication, post visit documentation, and face-to-face time with the patient discussing depression and anxiety. Discussed continuing current medication regimen.  Patient advised to contact office with any questions, adverse effects, or acute worsening in signs and symptoms.  Discussed  potential benefits, risk, and side effects of benzodiazepines to include potential risk of tolerance and dependence, as well as possible drowsiness.  Advised patient not to drive if experiencing drowsiness and to take lowest possible effective dose to minimize risk of dependence and tolerance.  There are no diagnoses linked to this encounter.   Please see After Visit Summary for patient specific instructions.  No future appointments.  No orders of the defined types were placed in this encounter.   -------------------------------

## 2024-05-29 ENCOUNTER — Ambulatory Visit: Admitting: Adult Health
